# Patient Record
Sex: Male | Born: 1960 | Race: White | Hispanic: No | Marital: Married | Smoking: Current every day smoker
Health system: Southern US, Community
[De-identification: ages and names within clinical notes are randomized; demographics above are authoritative.]

## PROBLEM LIST (undated history)

## (undated) DIAGNOSIS — E785 Hyperlipidemia, unspecified: Secondary | ICD-10-CM

## (undated) DIAGNOSIS — M1990 Unspecified osteoarthritis, unspecified site: Secondary | ICD-10-CM

## (undated) HISTORY — PX: APPENDECTOMY: SHX54

## (undated) HISTORY — DX: Hyperlipidemia, unspecified: E78.5

## (undated) HISTORY — PX: REPLACEMENT TOTAL KNEE: SUR1224

---

## 2009-03-08 ENCOUNTER — Inpatient Hospital Stay (HOSPITAL_COMMUNITY): Admission: RE | Admit: 2009-03-08 | Discharge: 2009-03-10 | Payer: Self-pay | Admitting: Specialist

## 2011-03-30 LAB — BASIC METABOLIC PANEL
BUN: 9 mg/dL (ref 6–23)
CO2: 27 mEq/L (ref 19–32)
CO2: 28 mEq/L (ref 19–32)
Calcium: 8.4 mg/dL (ref 8.4–10.5)
GFR calc Af Amer: >60 mL/min (ref >60)
GFR calc non Af Amer: >60 mL/min (ref >60)
Glucose, Bld: 127 mg/dL — ABNORMAL HIGH (ref 70–99)
Glucose, Bld: 146 mg/dL — ABNORMAL HIGH (ref 70–99)
Potassium: 4.1 mEq/L (ref 3.5–5.1)
Potassium: 4.4 mEq/L (ref 3.5–5.1)
Sodium: 139 mEq/L (ref 135–145)

## 2011-03-30 LAB — DIFFERENTIAL
Basophils Relative: 1 % (ref 0–1)
Eosinophils Absolute: 0.2 10*3/uL (ref 0.0–0.7)
Eosinophils Relative: 5 % (ref 0–5)
Lymphs Abs: 1.4 10*3/uL (ref 0.7–4.0)
Monocytes Absolute: 0.5 10*3/uL (ref 0.1–1.0)
Monocytes Relative: 13 % — ABNORMAL HIGH (ref 3–12)
Neutrophils Relative %: 46 % (ref 43–77)

## 2011-03-30 LAB — COMPREHENSIVE METABOLIC PANEL
ALT: 26 U/L (ref 0–53)
AST: 21 U/L (ref 0–37)
Albumin: 4.1 g/dL (ref 3.5–5.2)
Alkaline Phosphatase: 44 U/L (ref 39–117)
Calcium: 9.2 mg/dL (ref 8.4–10.5)
GFR calc Af Amer: >60 mL/min (ref >60)
Glucose, Bld: 101 mg/dL — ABNORMAL HIGH (ref 70–99)
Potassium: 4.4 mEq/L (ref 3.5–5.1)
Sodium: 144 mEq/L (ref 135–145)
Total Protein: 6.6 g/dL (ref 6.0–8.3)

## 2011-03-30 LAB — URINALYSIS, ROUTINE W REFLEX MICROSCOPIC
Bilirubin Urine: NEGATIVE
Hgb urine dipstick: NEGATIVE
Specific Gravity, Urine: 1.027 (ref 1.005–1.030)
Urobilinogen, UA: 1 mg/dL (ref 0.0–1.0)
pH: 6.5 (ref 5.0–8.0)

## 2011-03-30 LAB — PROTIME-INR: INR: 1 (ref 0.00–1.49)

## 2011-03-30 LAB — CROSSMATCH

## 2011-03-30 LAB — CBC
HCT: 30.9 % — ABNORMAL LOW (ref 39.0–52.0)
HCT: 31.4 % — ABNORMAL LOW (ref 39.0–52.0)
Hemoglobin: 10.5 g/dL — ABNORMAL LOW (ref 13.0–17.0)
Hemoglobin: 10.8 g/dL — ABNORMAL LOW (ref 13.0–17.0)
Hemoglobin: 14.6 g/dL (ref 13.0–17.0)
MCHC: 34 g/dL (ref 30.0–36.0)
MCHC: 34.4 g/dL (ref 30.0–36.0)
MCHC: 34.4 g/dL (ref 30.0–36.0)
Platelets: 126 10*3/uL — ABNORMAL LOW (ref 150–400)
RBC: 3.48 MIL/uL — ABNORMAL LOW (ref 4.22–5.81)
RDW: 13 % (ref 11.5–15.5)
RDW: 13.1 % (ref 11.5–15.5)
RDW: 13.2 % (ref 11.5–15.5)

## 2011-05-02 NOTE — Op Note (Signed)
NAMEJARRETT, ALBOR             ACCOUNT NO.:  [id number]   MEDICAL RECORD NO.:  [id number]          PATIENT TYPE:  INP   LOCATION:  1612                         FACILITY:  [facility]   PHYSICIAN:  Erasmo Leventhal, M.D.DATE OF BIRTH:  October 27, 1961   DATE OF PROCEDURE:  03/08/2009  DATE OF DISCHARGE:                               OPERATIVE REPORT   PREOPERATIVE DIAGNOSIS:  Right knee end-stage osteoarthritis.   POSTOPERATIVE DIAGNOSIS:  Right knee end-stage osteoarthritis.   PROCEDURE:  Right total knee arthroplasty.   SURGEON:  Valma Cava, M.D.   ASSISTANT:  Leilani Able, PA-C.   ANESTHESIA:  Spinal with monitored anesthesia care.   BLOOD LOSS:  Less than 100 cc.   DRAINS:  One  medium Hemovac.   COMPLICATIONS:  None.   TOURNIQUET TIME:  80 minutes at 300 mmHg.   COMPLICATIONS:  None.   DISPOSITION:  PACU stable.   OPERATIVE IMPLANTS:  Laural Benes and [employer] Sigma size 4 femur, size  5 tibia, 10 mm posterior stabilized rotating platform tibial insert.  41  mm all polyethylene patella all cemented without Vitoss.   OPERATIVE DETAILS:  The patient was counseled in the holding area and  correctly identified and taken to the operating room.  IV antibiotics  were given, spinal anesthetic was administered, and Foley catheter  placed utilizing sterile technique by the OR circulating nurse.  All  extremities were well padded using a bump.  The right knee had a 5  degree flexion contracture.  She could flex to 110 degrees.  It was  elevated, prepped with DuraPrep, draped in a sterile fashion and  exsanguinated with an Esmarch and the tourniquet was inflated to 300  mmHg.  A straight midline incision was made at the skin and subcutaneous  tissue.  Medial and lateral soft tissue flaps were developed at the  appropriate level.  Medial parapatellar arthrotomy was performed.  The  patella was retracted out of the way and the knee was flexed.  End-stage  arthritis changes.   Cruciate ligaments were resected.  A starting hole  was made in the distal femur, the canal was irrigated and the effluent  was clear.  Intramedullary rod was gently placed.  I chose a 5 degree  valgus cut and took a 10 mm cut off the distal femur.  This was found to  be a size #4.  Rotation marks were set and cut to fit a size 4.  Osteophytes were removed.  The medial and lateral menisci were removed  under direct visualization.  The geniculate vessels were coagulated.  The posterior neurovascular structures were followed out and protected  throughout the entire case.  Utilizing extramedullary alignment for the  tibia, it was set for a 10 mm cuff on the least deficient side, which  was the lateral side, and had a 0 degree slope.  Posteromedial and  posterolateral femoral osteophytes were removed.  With flexion-extension  blocks for a 10 we had well-balanced flexion/extension gaps.   The knee was then flexed, the tibia was found be a size 5.  Rotation  cover was set, reamer punch  was performed and a femoral box cut was  performed.  At this time, the size 4 femur and size 5 tibia and 10  insert were well-balanced in flexion/extension.  The collateral  ligaments were well-balanced, stable to varus and valgus stress at 0,  30, 60 and 90 degrees of flexion.  The patella was found to be a size  41, appropriate amount of bone was resected, and a patellar button was  applied with anatomic tracking.  All trials were removed and the knee  was irrigated with pulsatile lavage.  Utilizing modern __________ cement  technique, all components were cemented into place, size 4 femur, size 5  tibia, 41 mm  patella.  After the cement had cured, we did trials of 10  and 12.5 with a 10 mm insert.  We were well-balanced with full  extension/flexion, 110 degrees__________ , stable to varus  and valgus  stress.  The patellar track was anatomic.  The trials were removed.  Excess cement was removed and again the  knee was irrigated and a final  10 mm  posterior stabilized rotating platform tibial insert was  implanted.  A drain was placed.  Sequential closure of layers was done.  Arthrotomy closed with Vicryl, subcu Vicryl, skin with subcu Monocryl  suture and Steri-Strips were applied.  Sterile dressing applied to the  knee.  Tourniquet deflated.  Normal circulation in the foot and ankle at  the end of the case.  The patient tolerated the procedure well.  There  were no complications and he was taken to the recovery room in stable  condition.   To help with surgical technique and decision making, Mr. Leilani Able,  PA-C's assistance was needed throughout the entire case.           ______________________________  Erasmo Leventhal, M.D.     RAC/MEDQ  D:  03/08/2009  T:  03/08/2009  Job:  [id number]

## 2011-05-05 NOTE — Discharge Summary (Signed)
[name], [name]             ACCOUNT NO.:  [id number]   MEDICAL RECORD NO.:  [id number]          PATIENT TYPE:  INP   LOCATION:  1612                         FACILITY:  [facility]   PHYSICIAN:  Erasmo Leventhal, M.D.DATE OF BIRTH:  01-Sep-1961   DATE OF ADMISSION:  03/08/2009  DATE OF DISCHARGE:  03/10/2009                               DISCHARGE SUMMARY   ADMISSION DIAGNOSES:  1. End-stage osteoarthritis, right knee.  2. Hyperlipidemia.   HISTORY OF PRESENT ILLNESS:  Patient is a 50 year old gentleman with a  history of right knee pain and problems with range of motion,  weightbearing, weakness, giving way.  His evaluation showed he has  significant arthritic changes within the knee.  Patient has elected to  proceed with a total knee arthroplasty on the right.   PAST MEDICAL HISTORY:  Hyperlipidemia.   ALLERGIES:  NONE.   MEDICATIONS ON ADMISSION:  1. Aspirin.  2. Ibuprofen.  3. Pravastatin.  4. Fish oil.   SURGICAL PROCEDURE:  On March 08, 2009, patient was taken to the OR by  Dr. Hayden Rasmussen, assisted by Leilani Able, P.A.-C., and Arlyn Leak,  P.A.-C., under spinal anesthesia with MAC.  Patient underwent a right  total knee arthroplasty with a DePuy rotating platform system.  Patient  had no complications.  Minimal blood loss, was transferred to the  recovery room in good condition.  Patient had the following components  implanted:  A size 4 right femoral component, a size 5 five keel tibial  tray, a size four 10-mm thickness bearing, a 41-mm 3-pegged patella.  All components were implanted with polymethyl methacrylate.   CONSULTS:  The following consults were requested:  Physical therapy,  case management, pharmacy.   HOSPITAL COURSE:  On March 08, 2009, patient was admitted to [facility] under the care of Dr. Thomasena Edis.  Patient was taken to the OR  where a right total knee arthroplasty was performed without any  complications.  Patient was  transferred to the recovery room and then to  the orthopedic floor in good condition on IV antibiotics, pain  medicines, and Lovenox for DVT prophylaxis.  Patient was followed with  total knee protocol.  He was able to wean off IV antibiotics and pain  medicines well.  His pain was well controlled with p.o. meds.  Patient's  wound remained benign for any signs of infection.  His leg remained  neuromotor vascularly intact.  Patient progressed very well with  physical therapy and on postop day #2 in the evening Dr. Thomasena Edis felt  the patient was medically and orthopedically stable and ready for  discharge home.  Arrangements were made and he was discharged home in  good condition with outpatient home health physical therapy.   LABS:  CBC on admission found WBCs 3.8, hemoglobin 14.6, a hematocrit of  42.6, platelets 170.  On discharge, his white count was 7.7, hemoglobin  10.5, hematocrit 30.9, platelets 126.  Routine chemistries throughout  his hospitalization within normal limits.  His glucose was just labile  between 101 and 146.  Estimated GFR was greater than 60.  Preoperative  urinalysis was unremarkable.   DISCHARGE INSTRUCTIONS:   DIET:  No restrictions.   ACTIVITY:  Patient is to increase his activity as tolerated with use of  a walker.   WOUND CARE:  Patient is to change his dressing on a daily basis.  He is  to keep the wound clean and dry.   FOLLOWUP:  He needs a followup appointment with Dr. Thomasena Edis 2 weeks from  date of his surgery.  Patient is to call [phone] for that followup  appointment.   MEDICATIONS:  1. Lovenox 40-mg injection once a day for 14 days.  2. Robaxin 500 mg once every 6 hours for muscle spasms if needed.  3. Oxycodone 5 mg 1 or 2 tablets every 4 to 6 hours for pain if      needed.  4. Iron until gone.  5. After he completes his Lovenox, he is to go on aspirin 81 mg a day      for 3 weeks.  He is not to take his naproxen and ibuprofen       together.  6. He can use Colace 100 mg twice a day which is over the counter and      MiraLax p.r.n. constipation.  7. Pravastatin 40 mg once a day.  8. Aspirin 325 mg 1 tablet a day after completing his Lovenox  9. Fish oil is to be on hold until done with Lovenox.   CONDITION UPON DISCHARGE TO HOME:  Listed improved and good.      Jamelle Rushing, P.A.    ______________________________  Erasmo Leventhal, M.D.    RWK/MEDQ  D:  04/04/2009  T:  04/04/2009  Job:  [id number]   cc:   Erasmo Leventhal, M.D.  Fax: [phone]

## 2011-05-05 NOTE — H&P (Signed)
NAMEGLOYD, HAPP             ACCOUNT NO.:  [id number]   MEDICAL RECORD NO.:  [id number]          PATIENT TYPE:  INP   LOCATION:                               FACILITY:  [facility]   PHYSICIAN:  Erasmo Leventhal, M.D.DATE OF BIRTH:  May 28, 1961   DATE OF ADMISSION:  03/03/2009  DATE OF DISCHARGE:                              HISTORY & PHYSICAL   ADMITTING DIAGNOSIS:  End-stage osteoarthritis, right knee.   BRIEF HISTORY:  This is a 50 year old gentleman with a history of right  knee pain for years.  He has had multiple knee arthroscopies and  developed end-stage osteoarthritis of his knee due to weakness, giving  way pain, and inability to do his normal activities.  He now requests  total knee arthroplasty.  The surgery, risks, benefits, and aftercare  were discussed in detail with the patient, questions invited and  answered, and surgery to go ahead as scheduled.  His medical doctor is  Dr. Sol Passer and he has received medical clearance.   PAST MEDICAL HISTORY:   DRUG ALLERGIES:  NONE.   CURRENT MEDICATIONS:  1. Aspirin.  2. Ibuprofen.  3. Pravastatin.  4. Fish oil.   MEDICAL HISTORY:  Positive for hyperlipidemia.   PAST SURGERIES:  Include:  1. Knee arthroscopy.  2. Appendectomy.   FAMILY HISTORY:  Positive for MI and cancer.   SOCIAL HISTORY:  Patient is married.  He owns a chicken farm.  He has 3  drinks per week and smokes about 4 cigarettes a day.   REVIEW OF SYSTEMS:  CENTRAL NERVOUS SYSTEM:  Negative for headache,  blurred vision, or dizziness.  PULMONARY:  Negative for shortness  breath, PND, or orthopnea.  CARDIOVASCULAR:  Negative for chest pain or  palpitation.  GI:  Positive for reflux.  Negative for ulcers or  hepatitis.  GU:  Negative for urinary tract difficulty.  MUSCULOSKELETAL:  Positive in the HPI.   PHYSICAL EXAM:  BP 120/84.  Respirations 16.  Pulse 78 and regular.  GENERAL APPEARANCE :  This is a well-developed, well-nourished gentleman  in  no acute distress.  HEENT:  Head normocephalic.  Nose patent.  Ears patent.  Pupils equal,  round, and reactive to light.  Throat without injection.  NECK:  Supple without adenopathy.  Carotids 2+ without bruit.  CHEST:  Clear to auscultation.  No rales or rhonchi.  Respirations 16.  HEART:  Regular rate and rhythm at 78 beats per minute without murmur.  ABDOMEN:  Soft with active bowel sounds.  No masses or organomegaly.  NEUROLOGIC:  Patient alert and oriented to time, place, and person.  Cranial nerves II-XII grossly intact.  EXTREMITIES:  Shows the right knee with full extension, flexion to 130.  Neurovascularly, he is intact.  Dorsalis pedis and posterior tibialis  pulses are 2+.  There is crepitation through the range of motion.   X-rays show end-stage osteoarthritis of the right knee.   IMPRESSION:  End-stage osteoarthritis, right knee.   PLAN:  Total knee arthroplasty, right knee.      Jaquelyn Bitter. Chabon, P.A.    ______________________________  Erasmo Leventhal,  M.D.    SJC/MEDQ  D:  02/12/2009  T:  02/12/2009  Job:  [id number]

## 2016-07-29 ENCOUNTER — Emergency Department (HOSPITAL_COMMUNITY)
Admission: EM | Admit: 2016-07-29 | Discharge: 2016-07-29 | Disposition: A | Discharge status: Home / Self Care | Payer: BLUE CROSS/BLUE SHIELD | Attending: Emergency Medicine | Admitting: Emergency Medicine

## 2016-07-29 ENCOUNTER — Encounter (HOSPITAL_COMMUNITY): Payer: Self-pay

## 2016-07-29 ENCOUNTER — Emergency Department (HOSPITAL_COMMUNITY): Payer: BLUE CROSS/BLUE SHIELD

## 2016-07-29 DIAGNOSIS — F17210 Nicotine dependence, cigarettes, uncomplicated: Secondary | ICD-10-CM | POA: Diagnosis not present

## 2016-07-29 DIAGNOSIS — R079 Chest pain, unspecified: Secondary | ICD-10-CM

## 2016-07-29 DIAGNOSIS — Z96651 Presence of right artificial knee joint: Secondary | ICD-10-CM | POA: Diagnosis not present

## 2016-07-29 DIAGNOSIS — R0789 Other chest pain: Secondary | ICD-10-CM | POA: Insufficient documentation

## 2016-07-29 HISTORY — DX: Unspecified osteoarthritis, unspecified site: M19.90

## 2016-07-29 LAB — CBC
HCT: 42.8 % (ref 39.0–52.0)
Hemoglobin: 14.8 g/dL (ref 13.0–17.0)
MCH: 30.6 pg (ref 26.0–34.0)
MCHC: 34.6 g/dL (ref 30.0–36.0)
MCV: 88.6 fL (ref 78.0–100.0)
PLATELETS: 204 10*3/uL (ref 150–400)
RBC: 4.83 MIL/uL (ref 4.22–5.81)
RDW: 12.9 % (ref 11.5–15.5)
WBC: 8.1 10*3/uL (ref 4.0–10.5)

## 2016-07-29 LAB — BASIC METABOLIC PANEL
Anion gap: 8 (ref 5–15)
BUN: 17 mg/dL (ref 6–20)
CO2: 26 mmol/L (ref 22–32)
CREATININE: 1.08 mg/dL (ref 0.61–1.24)
Calcium: 9.2 mg/dL (ref 8.9–10.3)
Chloride: 101 mmol/L (ref 101–111)
GFR calc Af Amer: >60 mL/min (ref >60)
GLUCOSE: 107 mg/dL — AB (ref 65–99)
Potassium: 3.8 mmol/L (ref 3.5–5.1)
SODIUM: 135 mmol/L (ref 135–145)

## 2016-07-29 LAB — I-STAT TROPONIN, ED: Troponin i, poc: 0 ng/mL (ref 0.00–0.08)

## 2016-07-29 NOTE — ED Triage Notes (Signed)
To room via EMS.  Onset today 12:30p chest pain across anterior chest and epigastric region.  Pt thought it was heartburn took Tums x 12.  Pt took Zantac @ 4:30p.  BP was elevated.  Pt took Zantac about 4:30p-5p.  EMS gave ASA 324 mg. Pt reports while EMS was flushing IV pain resolved. No pain at this time.

## 2016-07-29 NOTE — ED Provider Notes (Signed)
Complains of anterior chest pain described as "heartburn" onset mildly last night to becoming acutely worse at noon time today. Symptoms resolved after he treated himself with an acids and with aspirin. He became asymptomatic upon arrival here. Pain was improved with exertion and walking around the room made worse with sitting. Presently asymptomatic on exam no distress lungs clear auscultation heart regular rate and rhythm no murmurs abdomen nondistended nontender extremity edema. I counseled patient 5 minutes on smoking cessation. Pain is felt to be atypical for acute coronary syndrome. Heart score equals 3. Chest x-ray viewed by me   Doug SouSam Nissim Fleischer, MD 07/29/16 2209

## 2016-07-29 NOTE — ED Provider Notes (Signed)
[facility]-EMERGENCY DEPT Provider Note   CSN: [id number] Arrival date & time: 07/29/16  1925  First Provider Contact:  First MD Initiated Contact with Patient 07/29/16 2033       History   Chief Complaint Chief Complaint  Patient presents with  . Chest Pain    HPI Kendrick FriesJeffrey D Row is a 55 y.o. male.  55 year old male with history of arthritis and dyslipidemia presents to the emergency department for evaluation of chest pain. Chest pain began early this morning, but worsened at 12:30 PM this afternoon. Patient states that pain was constant and unrelieved with Tums and Zantac. He noticed that it was worsened when seated and slightly improved when standing or upright. He believes that his symptoms were secondary to indigestion as he had some very strong coffee this morning and a few beers last night. He contact his son-in-law who is an EMT who took his blood pressure which was 170/100 and took an EKG which looked reassuring. Patient denies any associated shortness of breath, nausea, vomiting, leg swelling, diaphoresis, lightheadedness, syncope, or extremity numbness/weakness. He does report a family history of MI in his father at a similar age. Patient is a daily smoker. He is not on any medications for his HLD as he had myalgias from Lipitor and bloody stool from Crestor. He is not regularly followed by a PCP.   The history is provided by the patient and a relative. No language interpreter was used.  Chest Pain   Pertinent negatives include no diaphoresis, no fever, no nausea, no numbness, no vomiting and no weakness.    Past Medical History:  Diagnosis Date  . Arthritis     There are no active problems to display for this patient.   Past Surgical History:  Procedure Laterality Date  . APPENDECTOMY    . REPLACEMENT TOTAL KNEE Right       Home Medications    Prior to Admission medications   Not on File    Family History History reviewed. No pertinent family  history.  Social History Social History  Substance Use Topics  . Smoking status: Current Every Day Smoker    Packs/day: 0.40    Types: Cigarettes  . Smokeless tobacco: Never Used  . Alcohol use 7.2 oz/week    12 Cans of beer per week     Comment: started last week drinking glass of wine nightly, drinks beer on Fri and Sat nights      Allergies   Review of patient's allergies indicates no known allergies.   Review of Systems Review of Systems  Constitutional: Negative for diaphoresis and fever.  Cardiovascular: Positive for chest pain.  Gastrointestinal: Negative for nausea and vomiting.  Neurological: Negative for syncope, weakness, light-headedness and numbness.  Ten systems reviewed and are negative for acute change, except as noted in the HPI.     Physical Exam Updated Vital Signs BP 128/80   Pulse 69   Temp 98.3 F (36.8 C) (Oral)   Resp 16   Ht 6' (1.829 m)   Wt 79.4 kg   SpO2 97%   BMI 23.73 kg/m   Physical Exam  Constitutional: He is oriented to person, place, and time. He appears well-developed and well-nourished. No distress.  Nontoxic appearing and in no distress.  HENT:  Head: Normocephalic and atraumatic.  Eyes: Conjunctivae and EOM are normal. No scleral icterus.  Neck: Normal range of motion.  No JVD.  Cardiovascular: Normal rate, regular rhythm and intact distal pulses.   Pulmonary/Chest: Effort  normal. No respiratory distress. He has no wheezes. He has no rales.  Respirations even and unlabored. Lungs clear to auscultation bilaterally.  Abdominal: Soft. He exhibits no distension and no mass. There is no tenderness. There is no guarding.  Soft, nontender, nondistended abdomen. No masses. No peritoneal signs.  Musculoskeletal: Normal range of motion.  No lower extremity pitting edema  Neurological: He is alert and oriented to person, place, and time.  GCS 15. Patient moving all extremities.  Skin: Skin is warm and dry. No rash noted. He is not  diaphoretic. No erythema. No pallor.  Psychiatric: He has a normal mood and affect. His behavior is normal.  Nursing note and vitals reviewed.    ED Treatments / Results  Labs (all labs ordered are listed, but only abnormal results are displayed) Labs Reviewed  BASIC METABOLIC PANEL  CBC  I-STAT TROPOININ, ED    EKG  EKG Interpretation  Date/Time:  Saturday July 29 2016 19:36:23 EDT Ventricular Rate:  73 PR Interval:    QRS Duration: 94 QT Interval:  375 QTC Calculation: 414 R Axis:   26 Text Interpretation:  Sinus rhythm Borderline low voltage, extremity leads No old tracing to compare Confirmed by Ethelda Chick  MD, SAM [phone]) on 07/29/2016 7:44:43 PM       Radiology Dg Chest 2 View  Result Date: 07/29/2016 CLINICAL DATA:  Central chest pain EXAM: CHEST  2 VIEW COMPARISON:  None. FINDINGS: The heart size and mediastinal contours are within normal limits. Both lungs are clear. The visualized skeletal structures are unremarkable. IMPRESSION: No active cardiopulmonary disease. Electronically Signed   By: Alcide Clever M.D.   On: 07/29/2016 20:27    Procedures Procedures (including critical care time)  Medications Ordered in ED Medications - No data to display   Initial Impression / Assessment and Plan / ED Course  I have reviewed the triage vital signs and the nursing notes.  Pertinent labs & imaging results that were available during my care of the patient were reviewed by me and considered in my medical decision making (see chart for details).  Clinical Course    8:48 PM Patient is a heart score of 3-4 depending upon suspicion; low to moderate risk for acute coronary event. Based on atypical nature of symptoms, I would favor a heart score of 3 in this patient. PE though less likely as symptoms atypical for this. Patient has no leg swelling, tachycardia or hypoxia. Remainder of labs pending.   Final Clinical Impressions(s) / ED Diagnoses   Final diagnoses:    Nonspecific chest pain    55 year old male presents to the emergency department for evaluation of chest pain. Chest pain was constant until resolution shortly prior to arrival. Troponin was drawn more than 6 hours after onset of chest pain. Troponin resulted at 0.0. Remainder of laboratory workup is reassuring. EKG shows no ischemic change or STEMI. Chest x-ray is also reassuring with no evidence of acute cardiopulmonary disease.  Given atypical nature of symptoms and risk factors, patient with a heart score of 3 consistent with low risk of ACS. Symptoms also atypical for pulmonary embolus. It is likely that patient's chest pain was secondary to indigestion. Given risk factors and history of poor follow-up with outpatient physicians, I have urged the patient to see his doctor to discuss a plan to quit smoking. He has also been referred to [facility] cardiology group for reevaluation and completion of an outpatient stress test. No indication for further emergent workup at this time.  Patient discharged in satisfactory condition with no unaddressed concerns.   New Prescriptions New Prescriptions   No medications on file     Antony Madura, PA-C 07/29/16 2249    Doug Sou, MD 07/29/16 2252

## 2016-07-29 NOTE — Discharge Instructions (Signed)
Have Dr. Christy GentlesPoe help you stop smoking. Follow up with a cardiologist for recheck of your symptoms and for a stress test. Return for new or concerning symptoms.

## 2016-08-18 ENCOUNTER — Encounter: Payer: Self-pay | Admitting: Cardiology

## 2016-09-03 NOTE — Progress Notes (Signed)
Cardiology Office Note   Date:  09/04/2016   ID:  [name], DOB Sep 13, 1961, MRN [id number]  PCP:  Dina RichUGH,ROBERT, MD  Cardiologist:   Rollene RotundaJames Jenniferann Stuckert, MD  Referring:  Doug SouSam Jacubowitz, MD  Chief Complaint  Patient presents with  . Precordial Chest Pain     History of Present Illness: [name] is a 55 y.o. male who presents for evaluation of chest discomfort. He has no past cardiac history. However, in August he had some chest discomfort. This was happening one day rest. It was midsternal and across his entire chest. Sharp. It 8 out of 10 intensity. He took Tums. Zantac. He thought it might be indigestion. However, when he had his blood pressure checked by his son-in-law who is an EMT blood pressure was 170/110. He went to the emergency room. I reviewed these records. There was no evidence of ischemia. His blood pressure had improved and his pain is gone away prior to getting to the emergency room. He has since had no symptoms. 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 However, he has no symptoms with this level of activity. He is limited by arthritis. The patient denies any new symptoms such as chest discomfort, neck or arm discomfort. There has been no new shortness of breath, PND or orthopnea. There have been no reported palpitations, presyncope or syncope.  Past Medical History:  Diagnosis Date  . Arthritis   . Dyslipidemia     Past Surgical History:  Procedure Laterality Date  . APPENDECTOMY    . REPLACEMENT TOTAL KNEE Right      Current Outpatient Prescriptions  Medication Sig Dispense Refill  . aspirin 325 MG EC tablet Take 325 mg by mouth daily.     No current facility-administered medications for this visit.     Allergies:   Review of patient's allergies indicates no known allergies.    Social History:  The patient  reports that he has been smoking Cigarettes.  He has been smoking about 0.40 packs per day. He has  never used smokeless tobacco. He reports that he drinks about 7.2 oz of alcohol per week . He reports that he does not use drugs.   Family History:  The patient's family history includes Breast cancer in his mother; Heart attack (age of onset: 9358) in his father.    ROS:  Please see the history of present illness.   Otherwise, review of systems are positive for none.   All other systems are reviewed and negative.    PHYSICAL EXAM: VS:  BP 114/80 (BP Location: Left Arm, Patient Position: Sitting, Cuff Size: Normal)   Pulse 70   Ht 6' (1.829 m)   Wt 182 lb (82.6 kg)   BMI 24.68 kg/m  , BMI Body mass index is 24.68 kg/m. GENERAL:  Well appearing HEENT:  Pupils equal round and reactive, fundi not visualized, oral mucosa unremarkable NECK:  No jugular venous distention, waveform within normal limits, carotid upstroke brisk and symmetric, no bruits, no thyromegaly LYMPHATICS:  No cervical, inguinal adenopathy LUNGS:  Clear to auscultation bilaterally BACK:  No CVA tenderness CHEST:  Unremarkable HEART:  PMI not displaced or sustained,S1 and S2 within normal limits, no S3, no S4, no clicks, no rubs, no murmurs ABD:  Flat, positive bowel sounds normal in frequency in pitch, no bruits, no rebound, no guarding, no midline pulsatile mass, no hepatomegaly, no splenomegaly EXT:  2 plus pulses throughout, no  edema, no cyanosis no clubbing SKIN:  No rashes no nodules NEURO:  Cranial nerves II through XII grossly intact, motor grossly intact throughout PSYCH:  Cognitively intact, oriented to person place and time    EKG:  EKG is not ordered today. The ekg ordered 07/29/16 demonstrates sinus rhythm, rate 73, axis within normal limits, intervals within normal limits, no acute ST-T wave changes.   Recent Labs: 07/29/2016: BUN 17; Creatinine, Ser 1.08; Hemoglobin 14.8; Platelets 204; Potassium 3.8; Sodium 135    Lipid Panel No results found for: CHOL, TRIG, HDL, CHOLHDL, VLDL, LDLCALC, LDLDIRECT      Wt Readings from Last 3 Encounters:  09/04/16 182 lb (82.6 kg)  07/29/16 175 lb (79.4 kg)      Other studies Reviewed: Additional studies/ records that were reviewed today include: ED records. Review of the above records demonstrates:  Please see elsewhere in the note.     ASSESSMENT AND PLAN:  CHEST PAIN:  I think the pretest probability of obstructive coronary disease as the etiology of his pain is low. I will bring the patient back for a POET (Plain Old Exercise Test). This will allow me to screen for obstructive coronary disease, risk stratify and very importantly provide a prescription for exercise.  However, he has significant risk factors and a high possibility of nonobstructive disease at least. Any primary risk reduction. We had a long discussion about exercise and diet.  DYSLIPIDEMIA:  He has not tolerated Crestor or Lipitor. I will defer to DOUGH,ROBERT, MD would suggest with his family trying another agent and perhaps pravastatin would work. My suggestion would be to have his LDL at least less than 100 and ideally this happened he is with his history.  TOBACCO:  We discussed the need to quit smoking completely.  Current medicines are reviewed at length with the patient today.  The patient has concerns regarding medicines.  The following changes have been made:  no change  Labs/ tests ordered today include:   Orders Placed This Encounter  Procedures  . EXERCISE TOLERANCE TEST     Disposition:   FU with me as needed.     Signed, Rollene Rotunda, MD  09/04/2016 12:40 PM    [facility]

## 2016-09-04 ENCOUNTER — Encounter: Payer: Self-pay | Admitting: Cardiology

## 2016-09-04 ENCOUNTER — Ambulatory Visit (INDEPENDENT_AMBULATORY_CARE_PROVIDER_SITE_OTHER): Payer: BLUE CROSS/BLUE SHIELD | Admitting: Cardiology

## 2016-09-04 VITALS — BP 114/80 | HR 70 | Ht 72.0 in | Wt 182.0 lb

## 2016-09-04 DIAGNOSIS — R072 Precordial pain: Secondary | ICD-10-CM | POA: Diagnosis not present

## 2016-09-04 NOTE — Patient Instructions (Signed)

## 2016-09-15 ENCOUNTER — Inpatient Hospital Stay (HOSPITAL_COMMUNITY): Admission: RE | Admit: 2016-09-15 | Payer: BLUE CROSS/BLUE SHIELD | Source: Ambulatory Visit

## 2016-09-22 ENCOUNTER — Inpatient Hospital Stay (HOSPITAL_COMMUNITY): Admission: RE | Admit: 2016-09-22 | Payer: BLUE CROSS/BLUE SHIELD | Source: Ambulatory Visit

## 2016-09-22 ENCOUNTER — Telehealth (HOSPITAL_COMMUNITY): Payer: Self-pay

## 2016-09-22 NOTE — Telephone Encounter (Signed)
Encounter complete. 

## 2016-09-27 ENCOUNTER — Ambulatory Visit (HOSPITAL_COMMUNITY)
Admission: RE | Admit: 2016-09-27 | Discharge: 2016-09-27 | Disposition: A | Discharge status: Home / Self Care | Payer: BLUE CROSS/BLUE SHIELD | Source: Ambulatory Visit | Attending: Cardiovascular Disease | Admitting: Cardiovascular Disease

## 2016-09-27 DIAGNOSIS — R072 Precordial pain: Secondary | ICD-10-CM | POA: Insufficient documentation

## 2016-09-28 LAB — EXERCISE TOLERANCE TEST
CHL CUP MPHR: 165 {beats}/min
CHL CUP RESTING HR STRESS: 66 {beats}/min
CHL RATE OF PERCEIVED EXERTION: 17
CSEPEDS: 6 s
CSEPEW: 11.8 METS
Exercise duration (min): 10 min
Peak HR: 162 {beats}/min
Percent HR: 98 %

## 2016-10-02 ENCOUNTER — Telehealth: Payer: Self-pay | Admitting: Cardiology

## 2016-10-02 NOTE — Telephone Encounter (Signed)
Returned call. Results given, patient verbalized understanding and thanks. Report faxed to Dr. Sol Passerough.

## 2016-10-02 NOTE — Telephone Encounter (Signed)
New message ° ° °Pt verbalized that he is returning call to rn °

## 2017-06-06 IMAGING — CR DG CHEST 2V
2 series · 2 of 2 positions shown · non-contrast
Comparison: None.

CLINICAL DATA: Central chest pain

EXAM:
CHEST  2 VIEW

[chest pa]
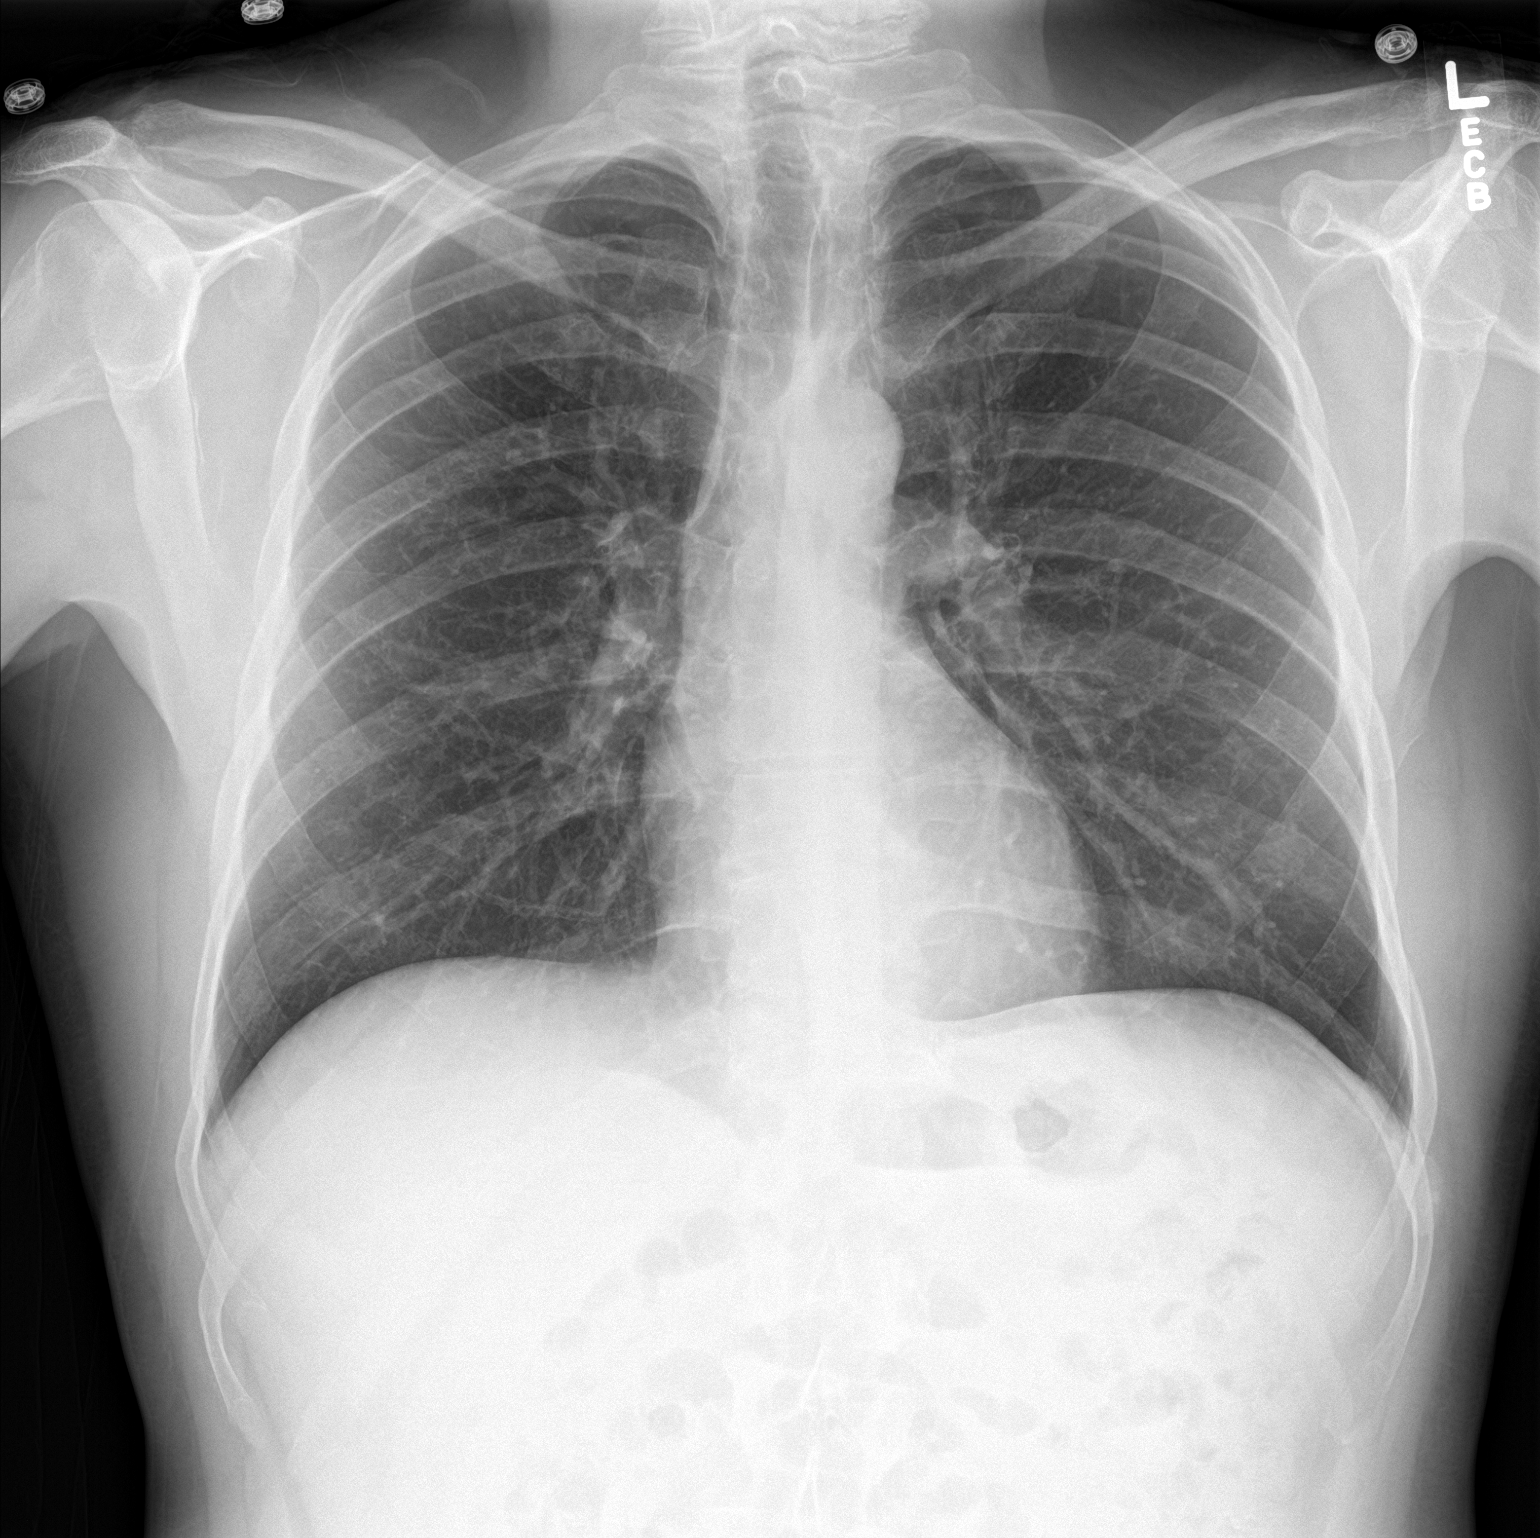

[chest lat]
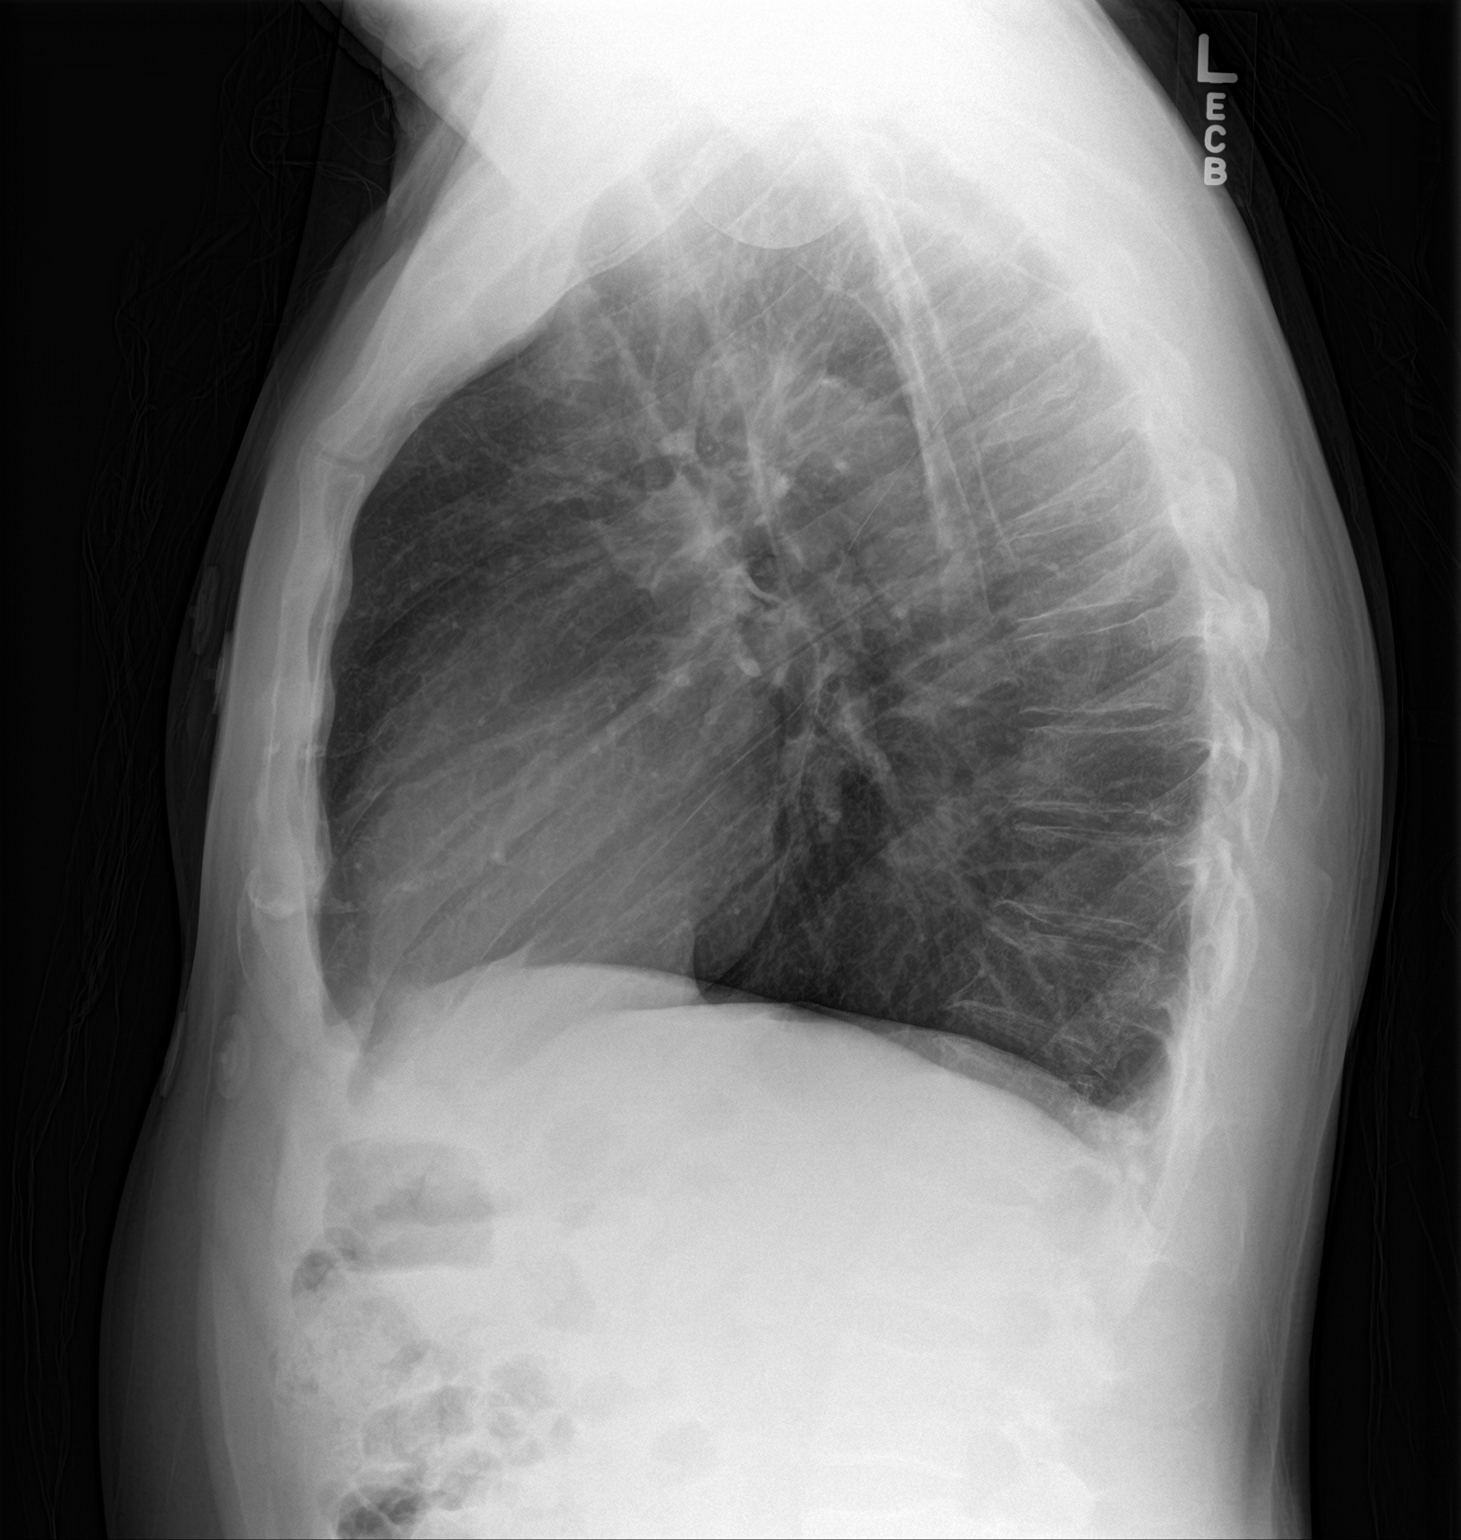

[2 of 2 positions shown; findings below may reference images not displayed]

FINDINGS: The heart size and mediastinal contours are within normal limits.
Both lungs are clear. The visualized skeletal structures are
unremarkable.
IMPRESSION: No active cardiopulmonary disease.

## 2024-05-02 NOTE — Progress Notes (Unsigned)
 Subjective:  Patient ID: [name], male    DOB: 02/07/1961  Age: 63 y.o. MRN: [id number]  Chief Complaint  Patient presents with   New Patient (Initial Visit)    Patient is establishing as a new patient. He is concerned his heart is not working the same than the last year. His father had history of Afib and heart attack. Patient has hx hyperlipidemia. He would like to be check his levels and take care of his health.  HPI  Discussed the use of AI scribe software for clinical note transcription with the patient, who gave verbal consent to proceed.  History of Present Illness   [name] is a 63 year old male with atrial fibrillation who presents for evaluation of cholesterol management and joint pain.  He has a history of atrial fibrillation and is concerned about his cholesterol levels. He previously tried Lipitor but discontinued it due to severe side effects, including exacerbation of his arthritis symptoms, which he describes as 'people sitting around poking you with sticks.'  He has significant arthritis, described as 'pretty bad,' and underwent a knee replacement at 47. 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 Lipitor worsens his arthritis symptoms.  He experienced a severe episode of constipation last year, attributed to dehydration and medication use during a kidney stone episode. He describes it as 'miserable,' with bloating and inability to eat or drink. He recalls a similar episode during childhood following an appendectomy. He has not had a colonoscopy and is concerned about his colon health, although he denies recent blood in his stool except for one instance related to cholesterol medication.  He reports erectile dysfunction over the past three months and is worried it could be related to his heart health. No chest pain, but he notes occasional dizziness, especially  after strenuous activity.  He smokes occasionally, about two cigarettes a day, and uses marijuana and alcohol socially, particularly on weekends. He drinks six to eight cups of coffee daily and maintains a diet he considers healthy, avoiding sugar and drinking plenty of water.  He has a history of earwax buildup, managed with earwax softener to prevent vertigo. No recent cough or sore throat.         05/05/2024    9:47 AM  Depression screen PHQ 2/9  Decreased Interest 0  Down, Depressed, Hopeless 0  PHQ - 2 Score 0          No data to display           No current outpatient medications on file prior to visit.   No current facility-administered medications on file prior to visit.  . Social History   Socioeconomic History   Marital status: Married    Spouse name: Not on file   Number of children: 2   Years of education: 12th   Highest education level: High school graduate  Occupational History   Occupation: Visual merchandiser  Tobacco Use   Smoking status: Every Day    Current packs/day: 1.00    Types: Cigarettes   Smokeless tobacco: Never   Tobacco comments:    1/2 ppd for 25 years.   Vaping Use   Vaping status: Never Used  Substance and Sexual Activity   Alcohol use: Yes    Alcohol/week: 4.0 standard drinks of alcohol    Types: 2 Cans of beer, 2 Shots of liquor per week  Comment: daily   Drug use: Yes    Frequency: 1.0 times per week    Types: Marijuana    Comment: weekend   Sexual activity: Yes    Partners: Female  Other Topics Concern   Not on file  Social History Narrative   Visual merchandiser   Social Drivers of Health   Financial Resource Strain: Not on file  Food Insecurity: Not on file  Transportation Needs: Not on file  Physical Activity: Not on file  Stress: Not on file  Social Connections: Not on file   Past Medical History:  Diagnosis Date   Arthritis    Dyslipidemia    Family History  Problem Relation Age of Onset   Breast cancer Mother    Heart  attack Father 6       Alive age 65   Atrial fibrillation Father     Review of Systems  Constitutional:  Negative for appetite change, fatigue and fever.  HENT:  Negative for congestion, ear pain, sinus pressure and sore throat.   Respiratory:  Negative for cough, chest tightness, shortness of breath and wheezing.   Cardiovascular:  Negative for chest pain and palpitations.  Gastrointestinal:  Negative for abdominal pain, constipation, diarrhea, nausea and vomiting.  Genitourinary:  Negative for dysuria and hematuria.  Musculoskeletal:  Negative for arthralgias, back pain, joint swelling and myalgias.  Skin:  Negative for rash.  Neurological:  Negative for dizziness, weakness and headaches.  Psychiatric/Behavioral:  Negative for dysphoric mood. The patient is not nervous/anxious.      Objective:  BP 118/64   Pulse 65   Temp (!) 97.4 F (36.3 C)   Resp 16   Ht 6' (1.829 m)   Wt 177 lb (80.3 kg)   SpO2 99%   BMI 24.01 kg/m      05/05/2024    9:32 AM 09/04/2016    9:36 AM 07/29/2016   10:21 PM  BP/Weight  Systolic BP 118 114 112  Diastolic BP 64 80 75  Wt. (Lbs) 177 182   BMI 24.01 kg/m2 24.68 kg/m2     Physical Exam Constitutional:      Appearance: Normal appearance.  HENT:     Right Ear: Tympanic membrane normal.     Left Ear: Tympanic membrane normal.     Nose: Nose normal.     Mouth/Throat:     Pharynx: No oropharyngeal exudate or posterior oropharyngeal erythema.  Eyes:     Conjunctiva/sclera: Conjunctivae normal.  Neck:     Vascular: No carotid bruit.  Cardiovascular:     Rate and Rhythm: Normal rate and regular rhythm.     Heart sounds: Normal heart sounds.  Pulmonary:     Effort: Pulmonary effort is normal.     Breath sounds: Normal breath sounds.  Abdominal:     General: Bowel sounds are normal.     Palpations: Abdomen is soft.     Tenderness: There is no abdominal tenderness.  Skin:    Findings: Lesion present. No rash.          Comments:  Seborrheic keratosis.   Neurological:     Mental Status: He is alert and oriented to person, place, and time.  Psychiatric:        Behavior: Behavior normal.     Media Information   Document Information  Photos  Upper back  05/05/2024 10:27  Attached To:  Office Visit on 05/05/24 with Odilia Bennett, PA  Source Information  [place], [place], [place]  [facility]  Document History      Media Information   Document Information  Photos  Low back  05/05/2024 10:27  Attached To:  Office Visit on 05/05/24 with Odilia Bennett, PA  Source Information  Odilia Bennett, [place]  [facility]  Document History    Diabetic Foot Exam - Simple   No data filed      Lab Results  Component Value Date   WBC 8.1 07/29/2016   HGB 14.8 07/29/2016   HCT 42.8 07/29/2016   PLT 204 07/29/2016   GLUCOSE 107 (H) 07/29/2016   ALT 26 02/26/2009   AST 21 02/26/2009   NA 135 07/29/2016   K 3.8 07/29/2016   CL 101 07/29/2016   CREATININE 1.08 07/29/2016   BUN 17 07/29/2016   CO2 26 07/29/2016   INR 1.0 02/26/2009   Skin excision  Date/Time: 05/05/2024 10:45 AM  Performed by: Odilia Bennett, PA Authorized by: Odilia Bennett, PA   Number of Lesions: 2 Lesion 1:    Body area: trunk   Trunk location: back   Initial size (mm): 5   Final defect size (mm): 10   Malignancy: benign lesion     Destruction method: cryotherapy     Repair comments: Cryotherapy used and left after freezing to fall off on its own. Patient tolerated procedure well. Lesion 2:    Body area: trunk   Trunk location: back   Initial size (mm): 10   Malignancy: benign lesion     Destruction method: cryotherapy     Repair comments: Cryotherapy used and left after freezing to fall off on its own. Patient tolerated procedure well.     Assessment & Plan:  General Health Maintenance Up to date on most vaccines. Interested in completing shingles vaccine series. - Administer second dose of shingles vaccine. -  Send referral for colonoscopy.       Mixed hyperlipidemia Assessment & Plan: Hyperlipidemia with statin intolerance. Active lifestyle and healthy diet noted. - Order cholesterol panel. - Consider low dose Crestor if cholesterol is elevated.  Orders: -     CBC with Differential/Platelet -     Comprehensive metabolic panel with GFR -     Lipid panel  Seborrheic keratosis Assessment & Plan: Seborrheic keratosis on the back. Consent obtained for cryotherapy. - Perform cryotherapy on lesions. - Monitor for recurrence and consider dermatology referral if lesions return.  Orders: -     Skin excision  Combined arterial insufficiency and corporo-venous occlusive erectile dysfunction Assessment & Plan: Erectile dysfunction for three months. Sildenafil  considered viable due to well-controlled blood pressure. - Prescribe sildenafil  as needed.  Orders: -     Sildenafil  Citrate; Take 1 tablet (25 mg total) by mouth daily as needed for erectile dysfunction.  Dispense: 20 tablet; Refill: 3  Chronic arthritis Assessment & Plan: Chronic osteoarthritis with knee involvement. Prefers to avoid medications, uses ibuprofen for pain. - Prescribe Voltaren  gel for topical pain relief. - Recommend turmeric supplement for systemic anti-inflammatory effect.  Orders: -     Diclofenac  Sodium; Apply 4 g topically 4 (four) times daily.  Dispense: 100 g; Refill: 3  Gastroesophageal reflux disease, unspecified whether esophagitis present Assessment & Plan: GERD with occasional symptoms, possibly exacerbated by erectile dysfunction medications. - Discuss potential for EGD during colonoscopy to assess for esophageal damage.   Family history of atrial fibrillation Assessment & Plan: Atrial fibrillation without symptoms. Concern about heart health due to erectile dysfunction. - Advise to report any palpitations or fluttering sensations.  Screening for diabetes mellitus -     Hemoglobin A1c -     T4,  free -     TSH  Screening for colorectal cancer -     Ambulatory referral to Gastroenterology  Need for shingles vaccine -     Varicella-zoster vaccine IM  Prostate cancer screening -     PSA     Meds ordered this encounter  Medications   sildenafil  (VIAGRA ) 25 MG tablet    Sig: Take 1 tablet (25 mg total) by mouth daily as needed for erectile dysfunction.    Dispense:  20 tablet    Refill:  3   diclofenac  Sodium (VOLTAREN  ARTHRITIS PAIN) 1 % GEL    Sig: Apply 4 g topically 4 (four) times daily.    Dispense:  100 g    Refill:  3     Orders Placed This Encounter  Procedures   Zoster, Recombinant (Shingrix )   CBC with Differential/Platelet   Comprehensive metabolic panel with GFR   Hemoglobin A1c   Lipid panel   T4, free   TSH   PSA   Ambulatory referral to Gastroenterology   Skin excision      I,Lauren M Auman,acting as a scribe for [employer], PA.,have documented all relevant documentation on the behalf of Odilia Bennett, PA,as directed by  Odilia Bennett, PA while in the presence of Odilia Bennett, [place].    Follow-up: Return in about 3 months (around 08/05/2024) for Chronic, Mirian Ames.  AVS was given to patient prior to departure.  Odilia Bennett, [place] [facility] [phone]

## 2024-05-05 ENCOUNTER — Encounter: Payer: Self-pay | Admitting: Physician Assistant

## 2024-05-05 ENCOUNTER — Ambulatory Visit: Admitting: Physician Assistant

## 2024-05-05 VITALS — BP 118/64 | HR 65 | Temp 97.4°F | Resp 16 | Ht 72.0 in | Wt 177.0 lb

## 2024-05-05 DIAGNOSIS — K219 Gastro-esophageal reflux disease without esophagitis: Secondary | ICD-10-CM | POA: Insufficient documentation

## 2024-05-05 DIAGNOSIS — Z125 Encounter for screening for malignant neoplasm of prostate: Secondary | ICD-10-CM | POA: Insufficient documentation

## 2024-05-05 DIAGNOSIS — L821 Other seborrheic keratosis: Secondary | ICD-10-CM | POA: Insufficient documentation

## 2024-05-05 DIAGNOSIS — E782 Mixed hyperlipidemia: Secondary | ICD-10-CM | POA: Insufficient documentation

## 2024-05-05 DIAGNOSIS — Z1212 Encounter for screening for malignant neoplasm of rectum: Secondary | ICD-10-CM

## 2024-05-05 DIAGNOSIS — Z1211 Encounter for screening for malignant neoplasm of colon: Secondary | ICD-10-CM | POA: Insufficient documentation

## 2024-05-05 DIAGNOSIS — N5203 Combined arterial insufficiency and corporo-venous occlusive erectile dysfunction: Secondary | ICD-10-CM | POA: Insufficient documentation

## 2024-05-05 DIAGNOSIS — Z23 Encounter for immunization: Secondary | ICD-10-CM | POA: Diagnosis not present

## 2024-05-05 DIAGNOSIS — Z8249 Family history of ischemic heart disease and other diseases of the circulatory system: Secondary | ICD-10-CM | POA: Insufficient documentation

## 2024-05-05 DIAGNOSIS — M1990 Unspecified osteoarthritis, unspecified site: Secondary | ICD-10-CM | POA: Insufficient documentation

## 2024-05-05 DIAGNOSIS — Z131 Encounter for screening for diabetes mellitus: Secondary | ICD-10-CM | POA: Insufficient documentation

## 2024-05-05 MED ORDER — DICLOFENAC SODIUM 1 % EX GEL
4.0000 g | Freq: Four times a day (QID) | CUTANEOUS | 3 refills | Status: AC
Start: 2024-05-05 — End: ?

## 2024-05-05 MED ORDER — SILDENAFIL CITRATE 25 MG PO TABS: 25.0000 mg | ORAL_TABLET | Freq: Every day | ORAL | 3 refills | Status: AC | PRN

## 2024-05-05 NOTE — Assessment & Plan Note (Signed)
 Atrial fibrillation without symptoms. Concern about heart health due to erectile dysfunction. - Advise to report any palpitations or fluttering sensations.

## 2024-05-05 NOTE — Assessment & Plan Note (Signed)
 Seborrheic keratosis on the back. Consent obtained for cryotherapy. - Perform cryotherapy on lesions. - Monitor for recurrence and consider dermatology referral if lesions return.

## 2024-05-05 NOTE — Assessment & Plan Note (Signed)
 Erectile dysfunction for three months. Sildenafil  considered viable due to well-controlled blood pressure. - Prescribe sildenafil  as needed.

## 2024-05-05 NOTE — Assessment & Plan Note (Signed)
 GERD with occasional symptoms, possibly exacerbated by erectile dysfunction medications. - Discuss potential for EGD during colonoscopy to assess for esophageal damage.

## 2024-05-05 NOTE — Assessment & Plan Note (Signed)
 Hyperlipidemia with statin intolerance. Active lifestyle and healthy diet noted. - Order cholesterol panel. - Consider low dose Crestor if cholesterol is elevated.

## 2024-05-05 NOTE — Assessment & Plan Note (Signed)
 Chronic osteoarthritis with knee involvement. Prefers to avoid medications, uses ibuprofen for pain. - Prescribe Voltaren  gel for topical pain relief. - Recommend turmeric supplement for systemic anti-inflammatory effect.

## 2024-05-05 NOTE — Patient Instructions (Signed)
 VISIT SUMMARY:  Today, you were seen for evaluation of your cholesterol management and joint pain. We discussed your history of atrial fibrillation, arthritis, and recent concerns about erectile dysfunction. You also shared your experience with severe constipation last year and your concerns about colon health. We reviewed your lifestyle habits, including smoking, alcohol, and coffee consumption, and your diet.  YOUR PLAN:  -ATRIAL FIBRILLATION: Atrial fibrillation is an irregular and often rapid heart rate that can increase your risk of strokes, heart failure, and other heart-related complications. Please report any palpitations or fluttering sensations you may experience.  -ERECTILE DYSFUNCTION: Erectile dysfunction is the inability to get or keep an erection firm enough for sexual intercourse. We will prescribe sildenafil  to be taken as needed.  -HYPERLIPIDEMIA: Hyperlipidemia is the presence of high levels of fats (lipids) in the blood, which can increase the risk of heart disease. We will order a cholesterol panel to check your levels and consider a low dose of Crestor if your cholesterol is elevated.  -OSTEOARTHRITIS: Osteoarthritis is a type of arthritis that occurs when flexible tissue at the ends of bones wears down. We will prescribe Voltaren  gel for topical pain relief and recommend a turmeric supplement for its anti-inflammatory effects.  -GASTROESOPHAGEAL REFLUX DISEASE (GERD): GERD is a digestive disorder that affects the ring of muscle between your esophagus and your stomach. We discussed the potential for an EGD during your colonoscopy to assess for any esophageal damage.  -SEBORRHEIC KERATOSIS: Seborrheic keratosis is a common noncancerous skin growth. We performed cryotherapy on the lesions on your back and will monitor for recurrence. If the lesions return, we may refer you to a dermatologist.  -GENERAL HEALTH MAINTENANCE: You are up to date on most vaccines and interested in  completing the shingles vaccine series. We administered the second dose of the shingles vaccine today and will send a referral for a colonoscopy.  INSTRUCTIONS:  Please follow up with any new or worsening symptoms. Report any palpitations or fluttering sensations related to your atrial fibrillation. Take sildenafil  as needed for erectile dysfunction. We will review your cholesterol panel results and consider a low dose of Crestor if necessary. Use Voltaren  gel as prescribed for osteoarthritis pain and consider taking a turmeric supplement. Monitor for any GERD symptoms and discuss the potential for an EGD during your colonoscopy. Watch for recurrence of seborrheic keratosis lesions and follow up if they return. Complete the colonoscopy as referred and continue with your general health maintenance.

## 2024-05-06 LAB — CBC WITH DIFFERENTIAL/PLATELET
Basophils Absolute: 0.1 10*3/uL (ref 0.0–0.2)
Basos: 1 %
EOS (ABSOLUTE): 0.2 10*3/uL (ref 0.0–0.4)
Eos: 4 %
Hematocrit: 47.2 % (ref 37.5–51.0)
Hemoglobin: 15.8 g/dL (ref 13.0–17.7)
Immature Grans (Abs): 0 10*3/uL (ref 0.0–0.1)
Immature Granulocytes: 0 %
Lymphocytes Absolute: 1.3 10*3/uL (ref 0.7–3.1)
Lymphs: 26 %
MCH: 30.4 pg (ref 26.6–33.0)
MCHC: 33.5 g/dL (ref 31.5–35.7)
MCV: 91 fL (ref 79–97)
Monocytes Absolute: 0.5 10*3/uL (ref 0.1–0.9)
Monocytes: 10 %
Neutrophils Absolute: 3 10*3/uL (ref 1.4–7.0)
Neutrophils: 59 %
Platelets: 237 10*3/uL (ref 150–450)
RBC: 5.2 x10E6/uL (ref 4.14–5.80)
RDW: 12.6 % (ref 11.6–15.4)
WBC: 5.1 10*3/uL (ref 3.4–10.8)

## 2024-05-06 LAB — COMPREHENSIVE METABOLIC PANEL WITH GFR
ALT: 14 IU/L (ref 0–44)
AST: 16 IU/L (ref 0–40)
Albumin: 4.4 g/dL (ref 3.9–4.9)
Alkaline Phosphatase: 76 IU/L (ref 44–121)
BUN/Creatinine Ratio: 16 (ref 10–24)
BUN: 15 mg/dL (ref 8–27)
Bilirubin Total: 0.7 mg/dL (ref 0.0–1.2)
CO2: 24 mmol/L (ref 20–29)
Calcium: 9.6 mg/dL (ref 8.6–10.2)
Chloride: 102 mmol/L (ref 96–106)
Creatinine, Ser: 0.96 mg/dL (ref 0.76–1.27)
Globulin, Total: 2.2 g/dL (ref 1.5–4.5)
Glucose: 99 mg/dL (ref 70–99)
Potassium: 4.8 mmol/L (ref 3.5–5.2)
Sodium: 139 mmol/L (ref 134–144)
Total Protein: 6.6 g/dL (ref 6.0–8.5)
eGFR: 89 mL/min/{1.73_m2} (ref >59)

## 2024-05-06 LAB — LIPID PANEL
Chol/HDL Ratio: 4.9 ratio (ref 0.0–5.0)
Cholesterol, Total: 233 mg/dL — ABNORMAL HIGH (ref 100–199)
HDL: 48 mg/dL (ref >39)
LDL Chol Calc (NIH): 163 mg/dL — ABNORMAL HIGH (ref 0–99)
Triglycerides: 123 mg/dL (ref 0–149)
VLDL Cholesterol Cal: 22 mg/dL (ref 5–40)

## 2024-05-06 LAB — HEMOGLOBIN A1C
Est. average glucose Bld gHb Est-mCnc: 117 mg/dL
Hgb A1c MFr Bld: 5.7 % — ABNORMAL HIGH (ref 4.8–5.6)

## 2024-05-06 LAB — PSA: Prostate Specific Ag, Serum: 1.4 ng/mL (ref 0.0–4.0)

## 2024-05-06 LAB — TSH: TSH: 0.951 u[IU]/mL (ref 0.450–4.500)

## 2024-05-06 LAB — T4, FREE: Free T4: 1.15 ng/dL (ref 0.82–1.77)

## 2024-05-07 ENCOUNTER — Ambulatory Visit: Payer: Self-pay | Admitting: Physician Assistant

## 2024-07-30 ENCOUNTER — Encounter: Payer: Self-pay | Admitting: Physician Assistant

## 2024-07-30 ENCOUNTER — Ambulatory Visit (INDEPENDENT_AMBULATORY_CARE_PROVIDER_SITE_OTHER): Admitting: Physician Assistant

## 2024-07-30 VITALS — BP 122/82 | HR 74 | Temp 97.3°F | Ht 72.0 in | Wt 184.0 lb

## 2024-07-30 DIAGNOSIS — Z8249 Family history of ischemic heart disease and other diseases of the circulatory system: Secondary | ICD-10-CM | POA: Diagnosis not present

## 2024-07-30 DIAGNOSIS — E782 Mixed hyperlipidemia: Secondary | ICD-10-CM

## 2024-07-30 DIAGNOSIS — J0120 Acute ethmoidal sinusitis, unspecified: Secondary | ICD-10-CM | POA: Diagnosis not present

## 2024-07-30 MED ORDER — PREDNISONE 20 MG PO TABS: ORAL_TABLET | ORAL | 0 refills | Status: DC

## 2024-07-30 MED ORDER — AZITHROMYCIN 250 MG PO TABS: ORAL_TABLET | ORAL | 0 refills | Status: AC

## 2024-07-30 NOTE — Patient Instructions (Signed)
 VISIT SUMMARY:  You came in today because of a persistent cough and sinus symptoms that have been bothering you for over a week. You also mentioned experiencing sinus pain, drainage, and a sore throat, especially at night. Additionally, you have a history of vertigo and allergies to certain medications. Your work environment exposes you to dust and dirt, which requires you to use hearing protection.  YOUR PLAN:  -ACUTE UPPER RESPIRATORY INFECTION WITH COUGH AND POSTNASAL DRAINAGE: An acute upper respiratory infection is a viral or bacterial infection that affects the nose, throat, and airways. You have been prescribed azithromycin  and prednisone  for 5 days to help manage your symptoms. Please make sure to complete the full course of antibiotics as directed.  -ACUTE LEFT OTITIS MEDIA: Acute left otitis media is an infection of the middle ear, which can cause pain and bulging. This is likely related to your upper respiratory infection. You have been prescribed azithromycin  and prednisone  for 5 days to treat this condition as well.  INSTRUCTIONS:  Please complete the full course of azithromycin  and prednisone  as prescribed. If your symptoms do not improve or worsen, please schedule a follow-up appointment. Make sure to stay hydrated and use Tylenol as needed for pain relief. Avoid exposure to dust and dirt as much as possible, and continue using hearing protection at work.

## 2024-07-30 NOTE — Progress Notes (Signed)
 Acute Office Visit  Subjective:    Patient ID: [name], male    DOB: 01/20/1961, 63 y.o.   MRN: [id number]  Chief Complaint  Patient presents with   Dry cough    HPI: Patient is in today for Acute cough  Discussed the use of AI scribe software for clinical note transcription with the patient, who gave verbal consent to proceed.  History of Present Illness   [name] is a 63 year old male who presents with a persistent cough and sinus symptoms.  He has been experiencing a persistent cough for over a week, producing watery, clear mucus. The cough is particularly disruptive at night, affecting his sleep quality. He notes that the cough is similar to what his wife had recently experienced.  In addition to the cough, he has sinus pain and drainage affecting his throat, leading to a sore throat at night, especially when dehydrated. He has been taking Tylenol before bed to manage these symptoms.  He recalls a similar experience when he had COVID, noting a quick onset of fever and a period of good sleep during that illness. He compares his current symptoms to that past experience.  He has a history of vertigo, which he manages by using ear protection and cleaning his ears regularly. He occasionally uses ear wax softener to maintain ear hygiene.  He is allergic to statins, specifically atorvastatin and rosuvastatin, which have previously caused nausea, blood in the stool, and joint pain.  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       Past Medical History:  Diagnosis Date   Arthritis    Dyslipidemia     Past Surgical History:  Procedure Laterality Date   APPENDECTOMY  1975   KNEE ARTHROSCOPY Left    REPLACEMENT TOTAL KNEE Right     Family History  Problem Relation Age of Onset   Breast cancer Mother    Heart attack Father 64       Alive age 67   Atrial fibrillation Father     Social  History   Socioeconomic History   Marital status: Married    Spouse name: Not on file   Number of children: 2   Years of education: 12th   Highest education level: High school graduate  Occupational History   Occupation: Visual merchandiser  Tobacco Use   Smoking status: Every Day    Current packs/day: 1.00    Types: Cigarettes   Smokeless tobacco: Never   Tobacco comments:    1/2 ppd for 25 years.   Vaping Use   Vaping status: Never Used  Substance and Sexual Activity   Alcohol use: Yes    Alcohol/week: 4.0 standard drinks of alcohol    Types: 2 Cans of beer, 2 Shots of liquor per week    Comment: daily   Drug use: Yes    Frequency: 1.0 times per week    Types: Marijuana    Comment: weekend   Sexual activity: Yes    Partners: Female  Other Topics Concern   Not on file  Social History Narrative   Visual merchandiser   Social Drivers of Health   Financial Resource Strain: Not on file  Food Insecurity: Not on file  Transportation Needs: Not on file  Physical Activity: Not on file  Stress: Not on file  Social Connections: Not on file  Intimate Partner Violence: Not on file    Outpatient  Medications Prior to Visit  Medication Sig Dispense Refill   diclofenac  Sodium (VOLTAREN  ARTHRITIS PAIN) 1 % GEL Apply 4 g topically 4 (four) times daily. 100 g 3   sildenafil  (VIAGRA ) 25 MG tablet Take 1 tablet (25 mg total) by mouth daily as needed for erectile dysfunction. 20 tablet 3   No facility-administered medications prior to visit.    Allergies  Allergen Reactions   Atorvastatin Other (See Comments)   Rosuvastatin Nausea Only    Blood in stool    Review of Systems  Constitutional:  Negative for appetite change, fatigue and fever.  HENT:  Negative for congestion, ear pain, sinus pressure and sore throat.   Respiratory:  Positive for cough. Negative for chest tightness, shortness of breath and wheezing.   Cardiovascular:  Negative for chest pain and palpitations.  Gastrointestinal:   Negative for abdominal pain, constipation, diarrhea, nausea and vomiting.  Genitourinary:  Negative for dysuria and hematuria.  Musculoskeletal:  Negative for arthralgias, back pain, joint swelling and myalgias.  Skin:  Negative for rash.  Neurological:  Negative for dizziness, weakness and headaches.  Psychiatric/Behavioral:  Negative for dysphoric mood. The patient is not nervous/anxious.        Objective:        07/30/2024   10:48 AM 05/05/2024    9:32 AM 09/04/2016    9:36 AM  Vitals with BMI  Height 6' 0 6' 0 6' 0  Weight 184 lbs 177 lbs 182 lbs  BMI 24.95 24 24.7   Systolic 122 118 885  Diastolic 82 64 80  Pulse 74 65 70     Data saved with a previous flowsheet row definition    Orthostatic VS for the past 72 hrs (Last 3 readings):  Patient Position BP Location  07/30/24 1048 Sitting Left Arm     Physical Exam Vitals reviewed.  Constitutional:      Appearance: Normal appearance.  HENT:     Right Ear: Hearing and ear canal normal. No middle ear effusion. Tympanic membrane is erythematous and bulging.     Left Ear: Hearing and ear canal normal. A middle ear effusion is present. Tympanic membrane is erythematous and bulging.     Mouth/Throat:     Pharynx: Oropharynx is clear. Posterior oropharyngeal erythema and postnasal drip present.  Neck:     Vascular: No carotid bruit.  Cardiovascular:     Rate and Rhythm: Normal rate and regular rhythm.     Heart sounds: Normal heart sounds.  Pulmonary:     Effort: Pulmonary effort is normal.     Breath sounds: Normal breath sounds.  Abdominal:     General: Bowel sounds are normal.     Palpations: Abdomen is soft.     Tenderness: There is no abdominal tenderness.  Neurological:     Mental Status: He is alert and oriented to person, place, and time.  Psychiatric:        Mood and Affect: Mood normal.        Behavior: Behavior normal.     Health Maintenance Due  Topic Date Due   HIV Screening  Never done    Hepatitis C Screening  Never done   Pneumococcal Vaccine: 19-49 Years (1 of 2 - PCV) Never done   Pneumococcal Vaccine: 50+ Years (1 of 2 - PCV) Never done   Colonoscopy  Never done   COVID-19 Vaccine (5 - 2024-25 season) 08/19/2023   INFLUENZA VACCINE  07/18/2024    There are no preventive care  reminders to display for this patient.   Lab Results  Component Value Date   TSH 0.951 05/05/2024   Lab Results  Component Value Date   WBC 5.1 05/05/2024   HGB 15.8 05/05/2024   HCT 47.2 05/05/2024   MCV 91 05/05/2024   PLT 237 05/05/2024   Lab Results  Component Value Date   NA 139 05/05/2024   K 4.8 05/05/2024   CO2 24 05/05/2024   GLUCOSE 99 05/05/2024   BUN 15 05/05/2024   CREATININE 0.96 05/05/2024   BILITOT 0.7 05/05/2024   ALKPHOS 76 05/05/2024   AST 16 05/05/2024   ALT 14 05/05/2024   PROT 6.6 05/05/2024   ALBUMIN 4.4 05/05/2024   CALCIUM 9.6 05/05/2024   ANIONGAP 8 07/29/2016   EGFR 89 05/05/2024   Lab Results  Component Value Date   CHOL 233 (H) 05/05/2024   Lab Results  Component Value Date   HDL 48 05/05/2024   Lab Results  Component Value Date   LDLCALC 163 (H) 05/05/2024   Lab Results  Component Value Date   TRIG 123 05/05/2024   Lab Results  Component Value Date   CHOLHDL 4.9 05/05/2024   Lab Results  Component Value Date   HGBA1C 5.7 (H) 05/05/2024       Assessment & Plan:  Acute non-recurrent ethmoidal sinusitis Assessment & Plan: Acute upper respiratory infection with persistent cough, clear sputum, sinus pain, and postnasal drainage. - Prescribed azithromycin  for 5 days. - Prescribed prednisone  for 5 days. - Advised to complete full course of antibiotics.  Orders: -     predniSONE ; Take 3 tablets (60 mg total) by mouth daily with breakfast for 3 days, THEN 2 tablets (40 mg total) daily with breakfast for 3 days, THEN 1 tablet (20 mg total) daily with breakfast for 3 days.  Dispense: 18 tablet; Refill: 0 -     Azithromycin ; Take 2  tablets on day 1, then 1 tablet daily on days 2 through 5  Dispense: 6 tablet; Refill: 0  Mixed hyperlipidemia Assessment & Plan: Controlled Continue to monitor diet and exercise Labs drawn today Will discuss results at follow up next week   Orders: -     Hemoglobin A1c -     Lipid panel  Family history of atrial fibrillation Assessment & Plan: [facility] labs today for chronic visit next week Continue to monitor symptoms Will discuss results at follow up next week.   Orders: -     CBC with Differential/Platelet -     Comprehensive metabolic panel with GFR    Assessment and Plan Meds ordered this encounter  Medications   predniSONE  (DELTASONE ) 20 MG tablet    Sig: Take 3 tablets (60 mg total) by mouth daily with breakfast for 3 days, THEN 2 tablets (40 mg total) daily with breakfast for 3 days, THEN 1 tablet (20 mg total) daily with breakfast for 3 days.    Dispense:  18 tablet    Refill:  0   azithromycin  (ZITHROMAX ) 250 MG tablet    Sig: Take 2 tablets on day 1, then 1 tablet daily on days 2 through 5    Dispense:  6 tablet    Refill:  0    Orders Placed This Encounter  Procedures   CBC with Differential/Platelet   Comprehensive metabolic panel with GFR   Hemoglobin A1c   Lipid panel     Follow-up: Return if symptoms worsen or fail to improve, for [facility].  An After Visit Summary  was printed and given to the patient.   I,Lauren M Auman,acting as a Neurosurgeon for [employer], PA.,have documented all relevant documentation on the behalf of Nola Angles, PA,as directed by  Nola Angles, PA while in the presence of Nola Angles, [place].    Nola Angles, [place] [facility] [phone]

## 2024-07-30 NOTE — Assessment & Plan Note (Signed)
 Drew labs today for chronic visit next week Continue to monitor symptoms Will discuss results at follow up next week.

## 2024-07-30 NOTE — Assessment & Plan Note (Signed)
 Controlled Continue to monitor diet and exercise Labs drawn today Will discuss results at follow up next week

## 2024-07-30 NOTE — Assessment & Plan Note (Signed)
 Acute upper respiratory infection with persistent cough, clear sputum, sinus pain, and postnasal drainage. - Prescribed azithromycin  for 5 days. - Prescribed prednisone  for 5 days. - Advised to complete full course of antibiotics.

## 2024-07-31 LAB — CBC WITH DIFFERENTIAL/PLATELET
Basophils Absolute: 0.1 x10E3/uL (ref 0.0–0.2)
Basos: 1 %
EOS (ABSOLUTE): 0.3 x10E3/uL (ref 0.0–0.4)
Eos: 5 %
Hematocrit: 46.8 % (ref 37.5–51.0)
Hemoglobin: 15.4 g/dL (ref 13.0–17.7)
Immature Grans (Abs): 0 x10E3/uL (ref 0.0–0.1)
Immature Granulocytes: 0 %
Lymphocytes Absolute: 1.4 x10E3/uL (ref 0.7–3.1)
Lymphs: 26 %
MCH: 30.9 pg (ref 26.6–33.0)
MCHC: 32.9 g/dL (ref 31.5–35.7)
MCV: 94 fL (ref 79–97)
Monocytes Absolute: 0.7 x10E3/uL (ref 0.1–0.9)
Monocytes: 13 %
Neutrophils Absolute: 3 x10E3/uL (ref 1.4–7.0)
Neutrophils: 55 %
Platelets: 275 x10E3/uL (ref 150–450)
RBC: 4.99 x10E6/uL (ref 4.14–5.80)
RDW: 12.8 % (ref 11.6–15.4)
WBC: 5.4 x10E3/uL (ref 3.4–10.8)

## 2024-07-31 LAB — COMPREHENSIVE METABOLIC PANEL WITH GFR
ALT: 20 IU/L (ref 0–44)
AST: 21 IU/L (ref 0–40)
Albumin: 4.4 g/dL (ref 3.9–4.9)
Alkaline Phosphatase: 77 IU/L (ref 44–121)
BUN/Creatinine Ratio: 28 — ABNORMAL HIGH (ref 10–24)
BUN: 25 mg/dL (ref 8–27)
Bilirubin Total: 0.5 mg/dL (ref 0.0–1.2)
CO2: 23 mmol/L (ref 20–29)
Calcium: 9.1 mg/dL (ref 8.6–10.2)
Chloride: 101 mmol/L (ref 96–106)
Creatinine, Ser: 0.88 mg/dL (ref 0.76–1.27)
Globulin, Total: 2.1 g/dL (ref 1.5–4.5)
Glucose: 88 mg/dL (ref 70–99)
Potassium: 5.3 mmol/L — ABNORMAL HIGH (ref 3.5–5.2)
Sodium: 139 mmol/L (ref 134–144)
Total Protein: 6.5 g/dL (ref 6.0–8.5)
eGFR: 97 mL/min/1.73 (ref >59)

## 2024-07-31 LAB — LIPID PANEL
Chol/HDL Ratio: 5.4 ratio — ABNORMAL HIGH (ref 0.0–5.0)
Cholesterol, Total: 231 mg/dL — ABNORMAL HIGH (ref 100–199)
HDL: 43 mg/dL (ref >39)
LDL Chol Calc (NIH): 163 mg/dL — ABNORMAL HIGH (ref 0–99)
Triglycerides: 135 mg/dL (ref 0–149)
VLDL Cholesterol Cal: 25 mg/dL (ref 5–40)

## 2024-07-31 LAB — HEMOGLOBIN A1C
Est. average glucose Bld gHb Est-mCnc: 108 mg/dL
Hgb A1c MFr Bld: 5.4 % (ref 4.8–5.6)

## 2024-08-04 ENCOUNTER — Ambulatory Visit: Payer: Self-pay | Admitting: Physician Assistant

## 2024-08-06 ENCOUNTER — Encounter: Payer: Self-pay | Admitting: Physician Assistant

## 2024-08-06 ENCOUNTER — Ambulatory Visit (INDEPENDENT_AMBULATORY_CARE_PROVIDER_SITE_OTHER): Admitting: Physician Assistant

## 2024-08-06 VITALS — BP 138/88 | HR 58 | Temp 97.8°F | Ht 72.0 in | Wt 184.0 lb

## 2024-08-06 DIAGNOSIS — N5203 Combined arterial insufficiency and corporo-venous occlusive erectile dysfunction: Secondary | ICD-10-CM

## 2024-08-06 DIAGNOSIS — R7303 Prediabetes: Secondary | ICD-10-CM | POA: Insufficient documentation

## 2024-08-06 DIAGNOSIS — E782 Mixed hyperlipidemia: Secondary | ICD-10-CM | POA: Diagnosis not present

## 2024-08-06 DIAGNOSIS — K219 Gastro-esophageal reflux disease without esophagitis: Secondary | ICD-10-CM

## 2024-08-06 MED ORDER — REPATHA 140 MG/ML ~~LOC~~ SOSY: 140.0000 mg | PREFILLED_SYRINGE | SUBCUTANEOUS | 3 refills | Status: AC

## 2024-08-06 NOTE — Assessment & Plan Note (Addendum)
 Erectile dysfunction for three months.  - Continue sildenafil  as needed.

## 2024-08-06 NOTE — Patient Instructions (Signed)
 VISIT SUMMARY:  You visited today to discuss the management of your long-standing hyperlipidemia, which has been difficult to control due to intolerance to multiple statins. We also reviewed your blood sugar levels, which have been high in the past but were normal during this visit.  YOUR PLAN:  -HYPERLIPIDEMIA, STATIN INTOLERANT: Hyperlipidemia means you have high levels of fats (lipids) in your blood, which can increase your risk of heart disease. You have experienced significant side effects from statins, a common treatment for this condition. We will start you on an injectable cholesterol-lowering medication every other week, pending insurance approval. A prescription has been sent to [facility] in [place], and we will provide a sample if available or notify you when the pharmacy has it.  -IMPAIRED FASTING GLUCOSE: Impaired fasting glucose means your blood sugar levels are higher than normal but not high enough to be classified as diabetes. Your recent levels were within the normal range, likely due to your dietary adjustments.  INSTRUCTIONS:  Please await notification from the pharmacy regarding the availability of your new medication. Continue with your current dietary adjustments to maintain your blood sugar levels within the normal range.

## 2024-08-06 NOTE — Assessment & Plan Note (Addendum)
 Uncontrolled Intolerant to Lipitor, Crestor, simvastatin due to muscle aches and blood in his stool Will start Repatha  140mg  every other week. Lab Results  Component Value Date   LDLCALC 163 (H) 07/30/2024

## 2024-08-06 NOTE — Progress Notes (Signed)
 Subjective:  Patient ID: [name], male    DOB: 30-Mar-1961  Age: 63 y.o. MRN: [id number]  Chief Complaint  Patient presents with   Medical Management of Chronic Issues    HPI:  Labs:idney function: Normal Liver function: Normal Cholesterol: Staying elevated, I see you were on Pravastatin 40mg  previously and were intolerant to it. Would you consider trying a different cholesterol medicine like Crestor or Lipitor? Let me know and we can start you on the lowest dose to see if you have any issues.  Blood count: normal A1C: 5.4, Normal  Prediabetes: A1C 5.4  Discussed the use of AI scribe software for clinical note transcription with the patient, who gave verbal consent to proceed.  History of Present Illness   [name] is a 63 year old male with hyperlipidemia who presents for management of hyperlipidemia.  He has a long-standing history of hyperlipidemia and has experienced intolerance to multiple statins over the past twenty years. He has tried atorvastatin and rosuvastatin, with the latter causing bloody stools, leading to discontinuation. He describes significant side effects from statins, stating they make him feel like 'an old man' and impact his ability to work, which he enjoys.  He mentions a family history of similar issues with statins, noting that his first cousin also experienced severe intolerance and opted for alternative treatments.  His blood sugar levels have been high in the past, but during this visit, they were within the normal range. He attributes some of his dietary habits, such as using cream and sugar in his coffee, to managing symptoms like acid reflux, which he believes the cream helps alleviate.          08/06/2024    8:59 AM 05/05/2024    9:47 AM  Depression screen PHQ 2/9  Decreased Interest 0 0  Down, Depressed, Hopeless 0 0  PHQ - 2 Score 0 0        08/06/2024    8:59 AM  Fall Risk   Falls in the past year? 0  Number falls in  past yr: 0  Injury with Fall? 0  Risk for fall due to : No Fall Risks  Follow up Falls evaluation completed    Patient Care Team: Milon Cleaves, [place] as PCP - General (Physician Assistant)   Review of Systems  Constitutional:  Negative for chills, diaphoresis, fatigue and fever.  HENT:  Negative for congestion, ear pain and sore throat.   Respiratory:  Negative for cough and shortness of breath.   Cardiovascular:  Negative for chest pain and leg swelling.  Gastrointestinal:  Negative for abdominal pain, constipation, diarrhea, nausea and vomiting.  Genitourinary:  Negative for dysuria and urgency.  Musculoskeletal:  Negative for arthralgias and myalgias.  Neurological:  Negative for dizziness and headaches.  Psychiatric/Behavioral:  Negative for dysphoric mood.     Current Outpatient Medications on File Prior to Visit  Medication Sig Dispense Refill   diclofenac  Sodium (VOLTAREN  ARTHRITIS PAIN) 1 % GEL Apply 4 g topically 4 (four) times daily. 100 g 3   sildenafil  (VIAGRA ) 25 MG tablet Take 1 tablet (25 mg total) by mouth daily as needed for erectile dysfunction. 20 tablet 3   No current facility-administered medications on file prior to visit.   Past Medical History:  Diagnosis Date   Arthritis    Dyslipidemia    Past Surgical History:  Procedure Laterality Date   APPENDECTOMY  1975   KNEE ARTHROSCOPY Left    REPLACEMENT TOTAL KNEE  Right     Family History  Problem Relation Age of Onset   Breast cancer Mother    Heart attack Father 25       Alive age 33   Atrial fibrillation Father    Social History   Socioeconomic History   Marital status: Married    Spouse name: Not on file   Number of children: 2   Years of education: 12th   Highest education level: High school graduate  Occupational History   Occupation: Visual merchandiser  Tobacco Use   Smoking status: Every Day    Current packs/day: 1.00    Types: Cigarettes   Smokeless tobacco: Never   Tobacco comments:    1/2  ppd for 25 years.   Vaping Use   Vaping status: Never Used  Substance and Sexual Activity   Alcohol use: Yes    Alcohol/week: 4.0 standard drinks of alcohol    Types: 2 Cans of beer, 2 Shots of liquor per week    Comment: daily   Drug use: Yes    Frequency: 1.0 times per week    Types: Marijuana    Comment: weekend   Sexual activity: Yes    Partners: Female  Other Topics Concern   Not on file  Social History Narrative   Visual merchandiser   Social Drivers of Health   Financial Resource Strain: Not on file  Food Insecurity: Not on file  Transportation Needs: Not on file  Physical Activity: Not on file  Stress: Not on file  Social Connections: Not on file    Objective:  BP 138/88 (BP Location: Left Arm, Patient Position: Sitting)   Pulse (!) 58   Temp 97.8 F (36.6 C) (Temporal)   Ht 6' (1.829 m)   SpO2 98%   BMI 24.95 kg/m      08/06/2024    8:56 AM 07/30/2024   10:48 AM 05/05/2024    9:32 AM  BP/Weight  Systolic BP 138 122 118  Diastolic BP 88 82 64  Wt. (Lbs)  184 177  BMI  24.95 kg/m2 24.01 kg/m2    Physical Exam Vitals reviewed.  Constitutional:      Appearance: Normal appearance. He is normal weight.  Cardiovascular:     Rate and Rhythm: Normal rate and regular rhythm.     Heart sounds: No murmur heard. Pulmonary:     Effort: Pulmonary effort is normal.     Breath sounds: Normal breath sounds.  Abdominal:     General: Abdomen is flat. Bowel sounds are normal.     Palpations: Abdomen is soft.     Tenderness: There is no abdominal tenderness.  Neurological:     Mental Status: He is alert and oriented to person, place, and time.  Psychiatric:        Mood and Affect: Mood normal.        Behavior: Behavior normal.         Lab Results  Component Value Date   WBC 5.4 07/30/2024   HGB 15.4 07/30/2024   HCT 46.8 07/30/2024   PLT 275 07/30/2024   GLUCOSE 88 07/30/2024   CHOL 231 (H) 07/30/2024   TRIG 135 07/30/2024   HDL 43 07/30/2024   LDLCALC 163 (H)  07/30/2024   ALT 20 07/30/2024   AST 21 07/30/2024   NA 139 07/30/2024   K 5.3 (H) 07/30/2024   CL 101 07/30/2024   CREATININE 0.88 07/30/2024   BUN 25 07/30/2024   CO2 23 07/30/2024   TSH  0.951 05/05/2024   INR 1.0 02/26/2009   HGBA1C 5.4 07/30/2024      Assessment & Plan:    Mixed hyperlipidemia Assessment & Plan: Uncontrolled Intolerant to Lipitor, Crestor, simvastatin due to muscle aches and blood in his stool Will start Repatha  140mg  every other week. Lab Results  Component Value Date   LDLCALC 163 (H) 07/30/2024       Orders: -     Repatha ; Inject 140 mg into the skin every 14 (fourteen) days.  Dispense: 2.1 mL; Refill: 3  Combined arterial insufficiency and corporo-venous occlusive erectile dysfunction Assessment & Plan: Erectile dysfunction for three months.  - Continue sildenafil  as needed.    Gastroesophageal reflux disease, unspecified whether esophagitis present Assessment & Plan: GERD with occasional symptoms, possibly exacerbated by morning coffe Continue to monitor symptoms Let us  know if symptoms change or get worse.    Prediabetes Assessment & Plan: Controlled Denies any changing or worsening symptoms Continue to monitor diet Lab Results  Component Value Date   HGBA1C 5.4 07/30/2024   HGBA1C 5.7 (H) 05/05/2024           Meds ordered this encounter  Medications   Evolocumab  (REPATHA ) 140 MG/ML SOSY    Sig: Inject 140 mg into the skin every 14 (fourteen) days.    Dispense:  2.1 mL    Refill:  3    No orders of the defined types were placed in this encounter.    Follow-up: Return in about 3 months (around 11/06/2024) for Chronic, Nola.   I,Katherina A Bramblett,acting as a scribe for [employer], PA.,have documented all relevant documentation on the behalf of Nola Angles, PA,as directed by  Nola Angles, PA while in the presence of Nola Angles, [place].   An After Visit Summary was printed and given to the patient.  Nola Angles, [place] [facility] [phone]

## 2024-08-06 NOTE — Assessment & Plan Note (Addendum)
 GERD with occasional symptoms, possibly exacerbated by morning coffe Continue to monitor symptoms Let us  know if symptoms change or get worse.

## 2024-08-06 NOTE — Assessment & Plan Note (Signed)
 Controlled Denies any changing or worsening symptoms Continue to monitor diet Lab Results  Component Value Date   HGBA1C 5.4 07/30/2024   HGBA1C 5.7 (H) 05/05/2024

## 2025-01-21 ENCOUNTER — Ambulatory Visit: Admitting: Physician Assistant

## 2025-01-21 ENCOUNTER — Ambulatory Visit: Payer: Self-pay | Admitting: *Deleted

## 2025-01-21 ENCOUNTER — Encounter: Payer: Self-pay | Admitting: Physician Assistant

## 2025-01-21 VITALS — BP 132/67 | HR 72 | Temp 97.8°F | Ht 72.0 in | Wt 178.0 lb

## 2025-01-21 DIAGNOSIS — R7303 Prediabetes: Secondary | ICD-10-CM

## 2025-01-21 DIAGNOSIS — Z1211 Encounter for screening for malignant neoplasm of colon: Secondary | ICD-10-CM

## 2025-01-21 DIAGNOSIS — Z23 Encounter for immunization: Secondary | ICD-10-CM

## 2025-01-21 DIAGNOSIS — J0100 Acute maxillary sinusitis, unspecified: Secondary | ICD-10-CM

## 2025-01-21 DIAGNOSIS — E782 Mixed hyperlipidemia: Secondary | ICD-10-CM

## 2025-01-21 MED ORDER — AZITHROMYCIN 250 MG PO TABS: ORAL_TABLET | ORAL | 0 refills | Status: AC

## 2025-01-21 MED ORDER — PROMETHAZINE-DM 6.25-15 MG/5ML PO SYRP: 5.0000 mL | ORAL_SOLUTION | Freq: Four times a day (QID) | ORAL | 0 refills | Status: AC | PRN

## 2025-01-21 NOTE — Assessment & Plan Note (Signed)
 Hypercholesterolemia Managed with rosuvastatin 20 mg and fish oil. Discussion about potential discontinuation of rosuvastatin if cholesterol levels are low, but emphasized the importance of continuing statin therapy for cardiovascular protection. - Ordered lipid panel as part of blood work. - Continue rosuvastatin 20 mg and fish oil. Orders:   CBC with Differential/Platelet   Lipid panel

## 2025-01-21 NOTE — Assessment & Plan Note (Signed)
 Orders:    Ambulatory referral to Gastroenterology

## 2025-01-21 NOTE — Telephone Encounter (Signed)
" °  FYI Only or Action Required?: FYI only for provider: appointment scheduled on 01/21/25.  Patient was last seen in primary care on 08/06/2024 by Milon Cleaves, PA.  Called Nurse Triage reporting Cough.  Symptoms began yesterday.  Interventions attempted: OTC medications: mucinex, sudafed, vit C, zinc  and Rest, hydration, or home remedies.  Symptoms are: gradually worsening.  Triage Disposition: See Physician Within 24 Hours  Patient/caregiver understands and will follow disposition?: Yes                 Reason for Disposition  [1] Continuous (nonstop) coughing interferes with work or school AND [2] no improvement using cough treatment per Care Advice  Answer Assessment - Initial Assessment Questions Patient and wife requesting appt today . Scheduled appt with PCP today .        1. ONSET: When did the cough begin?      Yesterday  2. SEVERITY: How bad is the cough today?      Coughing spells and starting to have pain in chest/ rib area from coughing  3. SPUTUM: Describe the color of your sputum (e.g., none, dry cough; clear, white, yellow, green)     Dry   5. DIFFICULTY BREATHING: Are you having difficulty breathing? If Yes, ask: How bad is it? (e.g., mild, moderate, severe)      No difficulty breathing  6. FEVER: Do you have a fever? If Yes, ask: What is your temperature, how was it measured, and when did it start?     Not sure  10. OTHER SYMPTOMS: Do you have any other symptoms? (e.g., runny nose, wheezing, chest pain)       Head congestion, teeth hurt headache, dry cough, coughing so much chest / ribs starting to hurt. Body aches. Did not check temp. No chest pain no dizziness, no difficulty breathing  Protocols used: Cough - Acute Non-Productive-A-AH  "

## 2025-01-21 NOTE — Assessment & Plan Note (Signed)
  Orders:   Comprehensive metabolic panel with GFR   Hemoglobin A1c

## 2025-01-21 NOTE — Progress Notes (Unsigned)
 "  Acute Office Visit  Subjective:    Patient ID: [name], male    DOB: Mar 28, 1961, 64 y.o.   MRN: [id number]  Chief Complaint  Patient presents with   Cough    HPI: Patient is in today for cough and sinus congestion  Discussed the use of AI scribe software for clinical note transcription with the patient, who gave verbal consent to proceed.  History of Present Illness [name] is a 64 year old male who presents with chronic dry cough, sinus pressure, and teeth pain.  He has been experiencing a chronic dry cough since having the flu during Christmas. The flu was severe, causing significant discomfort and a sensation of pain in his back, initially thought to be kidney pain. This pain seemed to move and obstruct his lung, making it difficult to breathe from one side. Recently, he started sneezing frequently, developed a scratchy throat, and experienced watery eyes. The cough is particularly disruptive at night, preventing him from sleeping.  He reports sinus pressure and pain in his teeth. No ear pain is present, but he notes a scratchy throat. He has a history of receiving a flu shot.  He describes pain above his head when coughing and notes he has a previous appendectomy incision. The pain is significant but not severe and is exacerbated by coughing. No blood in stool is reported, and bowel movements are soft when he eats properly.  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 He uses hearing protection to keep his ears clean. He has experienced nausea from statins in the past, which he describes as causing significant discomfort.       Past Medical History:  Diagnosis Date   Arthritis    Dyslipidemia     Past Surgical History:  Procedure Laterality Date   APPENDECTOMY  1975   KNEE ARTHROSCOPY Left    REPLACEMENT TOTAL KNEE Right     Family History  Problem Relation Age of Onset   Breast cancer  Mother    Heart attack Father 42       Alive age 73   Atrial fibrillation Father     Social History   Socioeconomic History   Marital status: Married    Spouse name: Not on file   Number of children: 2   Years of education: 12th   Highest education level: High school graduate  Occupational History   Occupation: Visual Merchandiser  Tobacco Use   Smoking status: Every Day    Current packs/day: 1.00    Types: Cigarettes   Smokeless tobacco: Never   Tobacco comments:    1/2 ppd for 25 years.   Vaping Use   Vaping status: Never Used  Substance and Sexual Activity   Alcohol use: Yes    Alcohol/week: 4.0 standard drinks of alcohol    Types: 2 Cans of beer, 2 Shots of liquor per week    Comment: daily   Drug use: Yes    Frequency: 1.0 times per week    Types: Marijuana    Comment: weekend   Sexual activity: Yes    Partners: Female  Other Topics Concern   Not on file  Social History Narrative   Visual Merchandiser   Social Drivers of Health   Tobacco Use: High Risk (01/21/2025)   Patient History    Smoking Tobacco Use: Every Day    Smokeless Tobacco Use: Never    Passive Exposure: Not on file  Financial Resource Strain: Not on file  Food Insecurity: Not on file  Transportation Needs: Not on file  Physical Activity: Not on file  Stress: Not on file  Social Connections: Not on file  Intimate Partner Violence: Not on file  Depression (PHQ2-9): Low Risk (08/06/2024)   Depression (PHQ2-9)    PHQ-2 Score: 0  Alcohol Screen: Not on file  Housing: Not on file  Utilities: Not on file  Health Literacy: Not on file    Outpatient Medications Prior to Visit  Medication Sig Dispense Refill   diclofenac  Sodium (VOLTAREN  ARTHRITIS PAIN) 1 % GEL Apply 4 g topically 4 (four) times daily. 100 g 3   Evolocumab  (REPATHA ) 140 MG/ML SOSY Inject 140 mg into the skin every 14 (fourteen) days. 2.1 mL 3   sildenafil  (VIAGRA ) 25 MG tablet Take 1 tablet (25 mg total) by mouth daily as needed for erectile  dysfunction. 20 tablet 3   No facility-administered medications prior to visit.    Allergies[1]  Review of Systems  Constitutional:  Negative for appetite change, fatigue and fever.  HENT:  Positive for rhinorrhea and sinus pressure. Negative for congestion, ear pain and sore throat.   Respiratory:  Positive for cough. Negative for chest tightness, shortness of breath and wheezing.   Cardiovascular:  Negative for chest pain and palpitations.  Gastrointestinal:  Negative for abdominal pain, constipation, diarrhea, nausea and vomiting.  Genitourinary:  Negative for dysuria and hematuria.  Musculoskeletal:  Negative for arthralgias, back pain, joint swelling and myalgias.  Skin:  Negative for rash.  Neurological:  Negative for dizziness, weakness and headaches.  Psychiatric/Behavioral:  Negative for dysphoric mood. The patient is not nervous/anxious.        Objective:        01/21/2025    4:09 PM 08/06/2024    8:56 AM 07/30/2024   10:48 AM  Vitals with BMI  Height 6' 0 6' 0 6' 0  Weight 178 lbs 184 lbs 184 lbs  BMI 24.14 24.95 24.95  Systolic 132 138 877  Diastolic 67 88 82  Pulse 72 58 74    Orthostatic VS for the past 72 hrs (Last 3 readings):  Patient Position BP Location  01/21/25 1609 Sitting Left Arm     Physical Exam Vitals reviewed.  Constitutional:      Appearance: Normal appearance.  HENT:     Right Ear: Swelling present. Tympanic membrane is erythematous and bulging.     Left Ear: Swelling present. Tympanic membrane is erythematous and bulging.  Neck:     Vascular: No carotid bruit.  Cardiovascular:     Rate and Rhythm: Normal rate and regular rhythm.     Heart sounds: Normal heart sounds.  Pulmonary:     Effort: Pulmonary effort is normal.     Breath sounds: Normal breath sounds.  Abdominal:     General: Bowel sounds are normal.     Palpations: Abdomen is soft.     Tenderness: There is no abdominal tenderness.  Neurological:     Mental Status: He  is alert and oriented to person, place, and time.  Psychiatric:        Mood and Affect: Mood normal.        Behavior: Behavior normal.     Health Maintenance Due  Topic Date Due   HIV Screening  Never done   Hepatitis C Screening  Never done   Pneumococcal Vaccine: 50+ Years (1 of 2 - PCV) Never done   Colonoscopy  Never  done   Influenza Vaccine  Never done   COVID-19 Vaccine (5 - 2025-26 season) 08/18/2024    There are no preventive care reminders to display for this patient.   Lab Results  Component Value Date   TSH 0.951 05/05/2024   Lab Results  Component Value Date   WBC 5.4 07/30/2024   HGB 15.4 07/30/2024   HCT 46.8 07/30/2024   MCV 94 07/30/2024   PLT 275 07/30/2024   Lab Results  Component Value Date   NA 139 07/30/2024   K 5.3 (H) 07/30/2024   CO2 23 07/30/2024   GLUCOSE 88 07/30/2024   BUN 25 07/30/2024   CREATININE 0.88 07/30/2024   BILITOT 0.5 07/30/2024   ALKPHOS 77 07/30/2024   AST 21 07/30/2024   ALT 20 07/30/2024   PROT 6.5 07/30/2024   ALBUMIN 4.4 07/30/2024   CALCIUM 9.1 07/30/2024   ANIONGAP 8 07/29/2016   EGFR 97 07/30/2024   Lab Results  Component Value Date   CHOL 231 (H) 07/30/2024   Lab Results  Component Value Date   HDL 43 07/30/2024   Lab Results  Component Value Date   LDLCALC 163 (H) 07/30/2024   Lab Results  Component Value Date   TRIG 135 07/30/2024   Lab Results  Component Value Date   CHOLHDL 5.4 (H) 07/30/2024   Lab Results  Component Value Date   HGBA1C 5.4 07/30/2024        Results for orders placed or performed in visit on 07/30/24  CBC with Differential/Platelet   Collection Time: 07/30/24 11:34 AM  Result Value Ref Range   WBC 5.4 3.4 - 10.8 x10E3/uL   RBC 4.99 4.14 - 5.80 x10E6/uL   Hemoglobin 15.4 13.0 - 17.7 g/dL   Hematocrit 53.1 62.4 - 51.0 %   MCV 94 79 - 97 fL   MCH 30.9 26.6 - 33.0 pg   MCHC 32.9 31.5 - 35.7 g/dL   RDW 87.1 88.3 - 84.5 %   Platelets 275 150 - 450 x10E3/uL    Neutrophils 55 Not Estab. %   Lymphs 26 Not Estab. %   Monocytes 13 Not Estab. %   Eos 5 Not Estab. %   Basos 1 Not Estab. %   Neutrophils Absolute 3.0 1.4 - 7.0 x10E3/uL   Lymphocytes Absolute 1.4 0.7 - 3.1 x10E3/uL   Monocytes Absolute 0.7 0.1 - 0.9 x10E3/uL   EOS (ABSOLUTE) 0.3 0.0 - 0.4 x10E3/uL   Basophils Absolute 0.1 0.0 - 0.2 x10E3/uL   Immature Granulocytes 0 Not Estab. %   Immature Grans (Abs) 0.0 0.0 - 0.1 x10E3/uL  Comprehensive metabolic panel with GFR   Collection Time: 07/30/24 11:34 AM  Result Value Ref Range   Glucose 88 70 - 99 mg/dL   BUN 25 8 - 27 mg/dL   Creatinine, Ser 9.11 0.76 - 1.27 mg/dL   eGFR 97 >40 fO/fpw/8.26   BUN/Creatinine Ratio 28 (H) 10 - 24   Sodium 139 134 - 144 mmol/L   Potassium 5.3 (H) 3.5 - 5.2 mmol/L   Chloride 101 96 - 106 mmol/L   CO2 23 20 - 29 mmol/L   Calcium 9.1 8.6 - 10.2 mg/dL   Total Protein 6.5 6.0 - 8.5 g/dL   Albumin 4.4 3.9 - 4.9 g/dL   Globulin, Total 2.1 1.5 - 4.5 g/dL   Bilirubin Total 0.5 0.0 - 1.2 mg/dL   Alkaline Phosphatase 77 44 - 121 IU/L   AST 21 0 - 40 IU/L   ALT  20 0 - 44 IU/L  Hemoglobin A1c   Collection Time: 07/30/24 11:34 AM  Result Value Ref Range   Hgb A1c MFr Bld 5.4 4.8 - 5.6 %   Est. average glucose Bld gHb Est-mCnc 108 mg/dL  Lipid panel   Collection Time: 07/30/24 11:34 AM  Result Value Ref Range   Cholesterol, Total 231 (H) 100 - 199 mg/dL   Triglycerides 864 0 - 149 mg/dL   HDL 43 >60 mg/dL   VLDL Cholesterol Cal 25 5 - 40 mg/dL   LDL Chol Calc (NIH) 836 (H) 0 - 99 mg/dL   Chol/HDL Ratio 5.4 (H) 0.0 - 5.0 ratio     Assessment & Plan:   Assessment & Plan Mixed hyperlipidemia  Orders:   CBC with Differential/Platelet   Lipid panel  Prediabetes  Orders:   Comprehensive metabolic panel with GFR   Hemoglobin A1c  Acute non-recurrent maxillary sinusitis  Orders:   azithromycin  (ZITHROMAX ) 250 MG tablet; Take 2 tablets on day 1, then 1 tablet daily on days 2 through 5    promethazine -dextromethorphan (PROMETHAZINE -DM) 6.25-15 MG/5ML syrup; Take 5 mLs by mouth 4 (four) times daily as needed for cough.  Screening for colorectal cancer  Orders:   Ambulatory referral to Gastroenterology     Body mass index is 24.14 kg/m.SABRA  Assessment and Plan Assessment & Plan Acute maxillary sinusitis Chronic dry cough, sinus pressure, and teeth pain suggest acute maxillary sinusitis. Examination showed sinus bulging and redness, indicating infection. - Prescribed azithromycin  (Z-Pak). - Prescribed promethazine  DM for cough relief, full dose to assess effectiveness. - Consider Tessalon Perles if promethazine  DM is ineffective.  Mixed hyperlipidemia Blood test planned for cholesterol assessment. - Ordered blood test for cholesterol levels.  General Health Maintenance Discussed flu vaccination and colonoscopy screening. He is open to scheduling a colonoscopy. - Administered flu shot. - Referred to GI for colonoscopy scheduling.  Recording duration: 13 minutes     No orders of the defined types were placed in this encounter.   No orders of the defined types were placed in this encounter.    Follow-up: No follow-ups on file.  An After Visit Summary was printed and given to the patient.   I,Lauren M Auman,acting as a neurosurgeon for [employer]have documented all relevant documentation on the behalf of Nola Angles, PA,as directed by  Nola Angles, PA while in the presence of Nola Angles, [place].    Nola Angles, [place] [facility] [phone]     [1]  Allergies Allergen Reactions   Atorvastatin Other (See Comments)   Rosuvastatin Nausea Only    Blood in stool   "

## 2025-01-22 LAB — COMPREHENSIVE METABOLIC PANEL WITH GFR
ALT: 13 [IU]/L (ref 0–44)
AST: 18 [IU]/L (ref 0–40)
Albumin: 4.2 g/dL (ref 3.9–4.9)
Alkaline Phosphatase: 72 [IU]/L (ref 47–123)
BUN/Creatinine Ratio: 21 (ref 10–24)
BUN: 19 mg/dL (ref 8–27)
Bilirubin Total: 0.7 mg/dL (ref 0.0–1.2)
CO2: 22 mmol/L (ref 20–29)
Calcium: 9 mg/dL (ref 8.6–10.2)
Chloride: 103 mmol/L (ref 96–106)
Creatinine, Ser: 0.92 mg/dL (ref 0.76–1.27)
Globulin, Total: 2.2 g/dL (ref 1.5–4.5)
Glucose: 96 mg/dL (ref 70–99)
Potassium: 4.2 mmol/L (ref 3.5–5.2)
Sodium: 141 mmol/L (ref 134–144)
Total Protein: 6.4 g/dL (ref 6.0–8.5)
eGFR: 93 mL/min/{1.73_m2}

## 2025-01-22 LAB — CBC WITH DIFFERENTIAL/PLATELET
Basophils Absolute: 0.1 10*3/uL (ref 0.0–0.2)
Basos: 1 %
EOS (ABSOLUTE): 0.2 10*3/uL (ref 0.0–0.4)
Eos: 4 %
Hematocrit: 43.4 % (ref 37.5–51.0)
Hemoglobin: 14.9 g/dL (ref 13.0–17.7)
Immature Grans (Abs): 0 10*3/uL (ref 0.0–0.1)
Immature Granulocytes: 0 %
Lymphocytes Absolute: 1.3 10*3/uL (ref 0.7–3.1)
Lymphs: 20 %
MCH: 30.5 pg (ref 26.6–33.0)
MCHC: 34.3 g/dL (ref 31.5–35.7)
MCV: 89 fL (ref 79–97)
Monocytes Absolute: 0.8 10*3/uL (ref 0.1–0.9)
Monocytes: 13 %
Neutrophils Absolute: 4 10*3/uL (ref 1.4–7.0)
Neutrophils: 62 %
Platelets: 222 10*3/uL (ref 150–450)
RBC: 4.88 x10E6/uL (ref 4.14–5.80)
RDW: 12.8 % (ref 11.6–15.4)
WBC: 6.3 10*3/uL (ref 3.4–10.8)

## 2025-01-22 LAB — HEMOGLOBIN A1C
Est. average glucose Bld gHb Est-mCnc: 114 mg/dL
Hgb A1c MFr Bld: 5.6 % (ref 4.8–5.6)

## 2025-01-22 LAB — LIPID PANEL
Chol/HDL Ratio: 2.7 ratio (ref 0.0–5.0)
Cholesterol, Total: 127 mg/dL (ref 100–199)
HDL: 47 mg/dL
LDL Chol Calc (NIH): 53 mg/dL (ref 0–99)
Triglycerides: 161 mg/dL — ABNORMAL HIGH (ref 0–149)
VLDL Cholesterol Cal: 27 mg/dL (ref 5–40)

## 2025-02-06 ENCOUNTER — Ambulatory Visit: Admitting: Physician Assistant
